# Patient Record
Sex: Male | Born: 1990 | Race: Black or African American | Hispanic: No | Marital: Single | Smoking: Former smoker
Health system: Southern US, Community
[De-identification: ages and names within clinical notes are randomized; demographics above are authoritative.]

## PROBLEM LIST (undated history)

## (undated) DIAGNOSIS — F209 Schizophrenia, unspecified: Secondary | ICD-10-CM

## (undated) DIAGNOSIS — F32A Depression, unspecified: Secondary | ICD-10-CM

## (undated) DIAGNOSIS — R440 Auditory hallucinations: Secondary | ICD-10-CM

## (undated) DIAGNOSIS — K219 Gastro-esophageal reflux disease without esophagitis: Secondary | ICD-10-CM

## (undated) DIAGNOSIS — R079 Chest pain, unspecified: Secondary | ICD-10-CM

## (undated) DIAGNOSIS — K602 Anal fissure, unspecified: Secondary | ICD-10-CM

## (undated) DIAGNOSIS — Z765 Malingerer [conscious simulation]: Secondary | ICD-10-CM

## (undated) DIAGNOSIS — Z5900 Homelessness unspecified: Secondary | ICD-10-CM

## (undated) DIAGNOSIS — G8929 Other chronic pain: Secondary | ICD-10-CM

## (undated) HISTORY — DX: Schizophrenia, unspecified: F20.9

## (undated) HISTORY — DX: Depression, unspecified: F32.A

---

## 2014-06-09 ENCOUNTER — Emergency Department: Payer: Self-pay

## 2014-06-09 ENCOUNTER — Emergency Department
Admission: EM | Admit: 2014-06-09 | Discharge: 2014-06-09 | Disposition: A | Discharge status: Home / Self Care | Payer: Charity | Attending: Emergency Medicine | Admitting: Emergency Medicine

## 2014-06-09 DIAGNOSIS — M79672 Pain in left foot: Secondary | ICD-10-CM | POA: Insufficient documentation

## 2014-06-09 DIAGNOSIS — M79671 Pain in right foot: Secondary | ICD-10-CM | POA: Insufficient documentation

## 2014-06-09 MED ORDER — ACETAMINOPHEN 500 MG PO TABS
1000.0000 mg | ORAL_TABLET | Freq: Once | ORAL | Status: AC
Start: 2014-06-09 — End: 2014-06-09
  Administered 2014-06-09: 1000 mg via ORAL
  Filled 2014-06-09: qty 2

## 2014-06-09 NOTE — ED Provider Notes (Signed)
The patient was seen and examined by the PA;  I have reviewed and agree with the history.  The pertinent physical exam has been documented and the plan of care was discussed with me.    [name] is a 24 y.o. male p/w b/l foot pain just PTA. [place] metro and then woke up with foot pain. He denies fall, trauma or other concerns       Pertinent physical: no TTP feet, legs, knees. FROM in BLE  ____________________________________________________________________    I was acting as a Neurosurgeon for Robert Baseman, MD on [facility] M  Treatment Team: Scribe: Kavin Leech     I am the first provider for this patient and I personally performed the services documented. Treatment Team: Scribe: Kavin Leech is scribing for me on [facility] M. This note accurately reflects work and decisions made by me.  Jaidence Geisler, Chipper Herb, MD____________________________________________________________________     Robert Baseman, MD  06/20/14 [phone]

## 2014-06-09 NOTE — Discharge Instructions (Signed)
You can take tylenol or ibuprofen as needed for pain.

## 2014-06-09 NOTE — ED Provider Notes (Signed)
Physician/Midlevel provider first contact with patient: 06/09/14 0117         History     Chief Complaint   Patient presents with   . Foot Pain     HPI Comments: 24 y/o male with no significant PMH presents with foot pain. Pt states that he [place] the wrong metro and when he woke up he had foot pain, that intermittently radiates to his knees. Denies falling/tripping/crushing foot or leg. Pt states before he got on the metro he was walking around [place] circle without difficulty. Has not tried anything for the pain. Denies HA/lightheadedness/dizziness, CP, SOB, SI/HI, hallucinations. Denies any illicit drug use.     Patient is a 24 y.o. male presenting with lower extremity pain. The history is provided by the patient.   Foot Pain  This is a new problem. The current episode started today. Pertinent negatives include no abdominal pain, chest pain, chills, coughing, diaphoresis, fatigue, fever, headaches, joint swelling, nausea, neck pain, numbness, rash, sore throat, vertigo, visual change, vomiting or weakness. He has tried nothing for the symptoms.            History reviewed. No pertinent past medical history.    History reviewed. No pertinent past surgical history.    No family history on file.    Social  History   Substance Use Topics   . Smoking status: Not on file   . Smokeless tobacco: Not on file   . Alcohol Use: Not on file       .     No Known Allergies    Current/Home Medications    No medications on file        Review of Systems   Constitutional: Negative for fever, chills, diaphoresis and fatigue.   HENT: Negative for sore throat.    Eyes: Negative for visual disturbance.   Respiratory: Negative for cough.    Cardiovascular: Negative for chest pain.   Gastrointestinal: Negative for nausea, vomiting and abdominal pain.   Genitourinary: Negative for dysuria.   Musculoskeletal: Negative for joint swelling and neck pain.   Skin: Negative for rash.   Neurological: Negative for dizziness, vertigo, weakness,  light-headedness, numbness and headaches.       Physical Exam    BP: 127/77 mmHg, Heart Rate: 61, Temp: 98.7 F (37.1 C), Resp Rate: 18, SpO2: 100 %, Weight: 72.576 kg    Physical Exam   Constitutional: He is oriented to person, place, and time. He appears well-developed and well-nourished.   HENT:   Head: Normocephalic and atraumatic.   Eyes: EOM are normal.   Pupils dilated bilaterally    Neck: Normal range of motion. Neck supple.   Cardiovascular: Normal rate and regular rhythm.    Pulses:       Dorsalis pedis pulses are 3+ on the right side, and 3+ on the left side.   Pulmonary/Chest: Effort normal and breath sounds normal. No respiratory distress. He has no wheezes.   Abdominal: Soft. Bowel sounds are normal. He exhibits no distension.   Musculoskeletal:        Right knee: Normal.        Left knee: Normal.        Right foot: There is normal range of motion and no deformity.        Left foot: There is normal range of motion and no deformity.   No TTP of feet or legs, full ROM without difficulty    Feet:   Right Foot:  Skin Integrity: Positive for dry skin. Negative for ulcer, blister, skin breakdown, erythema, warmth or callus.   Left Foot:   Skin Integrity: Positive for dry skin. Negative for ulcer, blister, skin breakdown, erythema, warmth or callus.   Neurological: He is alert and oriented to person, place, and time.   Skin: Skin is warm and dry.   Psychiatric: He has a normal mood and affect.   Nursing note and vitals reviewed.        MDM and ED Course     ED Medication Orders     None              MDM  Number of Diagnoses or Management Options  Diagnosis management comments: DDx: Pain, rash, blisters, sprain, stain      I have reviewed vital signs, nursing notes, past medical history, past surgical history, social history and family history         Amount and/or Complexity of Data Reviewed  Tests in the medicine section of CPT: reviewed (O2 saturation 100% on RA, no hypoxia)  Discuss the patient with  other providers: yes (ED attending)    Risk of Complications, Morbidity, and/or Mortality  Presenting problems: low  Diagnostic procedures: low  Management options: low    Patient Progress  Patient progress: stable         Procedures    Clinical Impression & Disposition   24 y/o male with no significant PMH presents with foot pain.    - On exam pt with dilated pupils, denies drug use. When patient distracted, no pain to feet or legs, stood up to walk to the bathroom without difficulty.   - Discussed with Dr. Dorthula Matas, agrees with d/c home.   - Discussed findings with patient, verbalized understanding, states that he does not have a place tonight, will have SW come to see patient.   - Will give dose of tylenol in ED.     Dx:  1. Pain in both feet        Plan: D/C home, transition clinc info given, homeless shelter list provided, return to ED precautions given.     Clinical Impression  Final diagnoses:   Pain in both feet        ED Disposition     Discharge Stevphen Meuse discharge to home/self care.    Condition at disposition: Stable             There are no discharge medications for this patient.       Treatment Team: Scribe: Gillie Manners [place], [place]  06/09/14 [phone]

## 2014-06-09 NOTE — ED Notes (Signed)
[place] from [place]. Pt c/o of sudden onset of right foot pain that radiates to his knee cap. Denies any trauma or injury. Denies any long trips or travel. Denies any CP, SOB, dizziness or nausea.

## 2014-06-11 ENCOUNTER — Emergency Department
Admission: EM | Admit: 2014-06-11 | Discharge: 2014-06-11 | Disposition: A | Discharge status: Home / Self Care | Payer: Charity | Attending: Emergency Medical Services | Admitting: Emergency Medical Services

## 2014-06-11 ENCOUNTER — Emergency Department: Payer: Self-pay

## 2014-06-11 ENCOUNTER — Emergency Department: Payer: Charity

## 2014-06-11 DIAGNOSIS — S93401A Sprain of unspecified ligament of right ankle, initial encounter: Secondary | ICD-10-CM | POA: Insufficient documentation

## 2014-06-11 DIAGNOSIS — Y9301 Activity, walking, marching and hiking: Secondary | ICD-10-CM | POA: Insufficient documentation

## 2014-06-11 MED ORDER — IBUPROFEN 600 MG PO TABS
600.0000 mg | ORAL_TABLET | Freq: Four times a day (QID) | ORAL | Status: DC | PRN
Start: 2014-06-11 — End: 2017-03-12

## 2014-06-11 NOTE — Progress Notes (Signed)
Cab voucher provided for pt to [employer].

## 2014-06-11 NOTE — Discharge Instructions (Signed)
Ankle Sprain    You have been diagnosed with an ankle sprain.    A sprain is a ligament injury, usually a tear or partial tear. Sprains can hurt as much as broken bones. Sprains can be classified by the degree of injury. A first-degree sprain is considered a minor tear. A second-degree sprain is a partial tear of the ligament. A third-degree sprain often involves a small fracture, or break, of the bone that the ligament is attached to.    Sprains are usually treated with pain medication and a splint to keep the joint from moving. You should Rest, Ice, Compress, and Elevate the injured ankle. Remember this as "RICE."   REST: Limit the use of the injured body part.   ICE: By applying ice to the affected area, swelling and pain can be reduced. Place some ice cubes in a re-sealable (Ziploc) bag and add some water. Put a thin washcloth between the bag and the skin. Apply the ice bag to the area for at least 20 minutes. Do this at least 4 times per day. Using the ice for longer times and more frequently is OK. NEVER APPLY ICE DIRECTLY TO THE SKIN.   COMPRESS: Compression means to apply pressure around the injured area such as with a splint, cast or an ACE bandage. Compression decreases swelling and improves comfort. Compression should be tight enough to relieve swelling but not so tight as to decrease circulation. Increasing pain, numbness, tingling, or change in skin color, are all signs of decreased circulation.   ELEVATE: Elevate the injured part. For example, a sprained ankle can be placed up on a chair while sitting and propped up on pillows while lying down.    You have been given an ACE BANDAGE. The bandage will compress the ankle. This increases comfort and reduces swelling. The ACE bandage should fit snugly but not so tight as to decrease circulation (blood supply). Watch for swelling of the area outside the ACE wrap. Check capillary refill (circulation) in your toenails. To do this, press on the  nail. It should turn white. When you let go, the nail should return to pink in less than 2 seconds. If it doesn't, the bandage is too tight. Loosen the wrap if you need to.    Wear the ACE bandage:   For the next 1-2 weeks.    Ankle exercises are described below. Begin the exercises as soon as you are able. They will make the ankle stronger to prevent new injuries. Do the exercises 5 to 10 times each day.   Use your big toe to draw out the letters of the alphabet on the ground. Move your ankle as you make each letter.   Sit with your leg straight out in front of you. Wrap a towel around the ball of your foot (just below your toes) and pull back. Pull hard enough to stretch the ankle. Don't pull hard enough to cause pain. Hold the stretch for 30 seconds.   Stand up. Rock onto the tiptoes of the injured foot and then return to the flat position. Repeat 10 times.   Rotate your ankle in a circle. Make 10 clockwise circles, then make 10 circles going the other way.    YOU SHOULD SEEK MEDICAL ATTENTION IMMEDIATELY, EITHER HERE OR AT THE NEAREST EMERGENCY DEPARTMENT, IF ANY OF THE FOLLOWING OCCURS:   Your pain gets much worse.   Your ankle or foot starts to tingle or it becomes numb.   Your foot   is cold or pale. This might mean there is a problem with circulation (blood supply).        JACOB CICERO  [id number]  [id number]  [phone]  06/11/2014    Discharge Instructions    As always, you are the most important factor in your recovery.  Please follow these instructions carefully.  If you have problems that we have not discussed, CALL OR VISIT YOUR DOCTOR RIGHT AWAY.     If you can't reach your doctor, return to the emergency department.    I Stevphen Meuse understand the written and discussed instructions.  My questions have been answered.  I acknowledge receipt of these instructions.     Patient or responsible person:         Patient's Signature               Physician or Nurse

## 2014-06-11 NOTE — ED Provider Notes (Signed)
[facility] EMERGENCY DEPARTMENT H&P                                             ATTENDING SUPERVISORY NOTE       ATTENDING NOTE        Turned ankle, exam unimpressive, xray negative, patient sleeping when I went to evaluate/     I spoke to and examined the patient as well: Yes  I was present during key portions of any procedures performed: N/A            VISIT INFORMATION        Clinical Course in the ED:            Medications Given in the ED:    .     ED Medication Orders     None            Procedures:            Interpretations:      Radiology -     interpreted by me with the following observations: No fracture               PAST HISTORY        Primary Care Provider: Christa See, MD        PMH/PSH:    .     History reviewed. No pertinent past medical history.    He has no past surgical history on file.      Social/Family History:      He reports that he has never smoked. He does not have any smokeless tobacco history on file. He reports that he does not drink alcohol or use illicit drugs.    No family history on file.      Listed Medications on Arrival:    .     Home Medications           No Medications         Allergies: He has No Known Allergies.            RESULTS        Lab Results:      Results     ** No results found for the last 24 hours. **              Radiology Results:      Ankle Right 3+ Views   Final Result    No appreciable fracture.      Prince Solian, MD    06/11/2014 8:30 PM                     Attending Attestation:      The patient was seen and examined by the mid-level (physician's assistant or nurse practitioner), or fellow, and the plan of care was discussed with me. I agree with the plan as it was presented to me.  I have reviewed and agree with the final ED diagnosis.              Scribe Attestation:      No scribe involved in the care of this patient           Juliane Poot, MD  06/11/14 2055

## 2014-06-11 NOTE — ED Provider Notes (Signed)
[facility] EMERGENCY DEPARTMENT APP H&P         CLINICAL SUMMARY      THIS CHART HAS BEEN COMPLETED AND IS READY TO SIGN.      Diagnosis:    .     Final diagnoses:   Sprain of right ankle, unspecified ligament, initial encounter         MDM Notes:      MDM  Number of Diagnoses or Management Options  Sprain of right ankle, unspecified ligament, initial encounter: new, needed workup     Amount and/or Complexity of Data Reviewed  Tests in the radiology section of CPT: ordered and reviewed  Discussion of test results with the performing providers: yes  Discuss the patient with other providers: yes  Independent visualization of images, tracings, or specimens: yes            Disposition:           ED Disposition     Discharge Stevphen Meuse discharge to home/self care.    Condition at disposition: Stable                      CLINICAL INFORMATION        HPI:      Chief Complaint: Ankle Injury  .    GERAL COKER is a 24 y.o. male with no pertinent PMhx who presents c/o right ankle pain with associated swelling onset just PTA. Pt was walking when he stepped on an uneven surface and twisted his ankle. No fall, head injury or LOC. He has been ambulatory with slight difficulty secondary to pain. Denies headaches, knee pain, nausea, vomiting, CP, SOB, paresthesias.    History obtained from: Patient      ROS:      Positive and negative ROS elements as per HPI.      Physical Exam:      Pulse 83  BP 114/71 mmHg  Resp 16  SpO2 96 %  Temp 98.7 F (37.1 C)    Physical Exam   Constitutional: He is oriented to person, place, and time. He appears well-developed and well-nourished. No distress.   HENT:   Head: Normocephalic and atraumatic.   Right Ear: External ear normal.   Left Ear: External ear normal.   Eyes: Conjunctivae and EOM are normal. Pupils are equal, round, and reactive to light.   Pulmonary/Chest: Effort normal. No respiratory distress.   Musculoskeletal:   Right ankle grossly  unremarkable. No deformity. No soft tissue swelling. Minimal TTP.  Medial tenderness at lateral malleolus. Foot non tender. Achilles intact. No deficit. Knee unremarkable. FROM.    Neurological: He is alert and oriented to person, place, and time. Coordination normal.   Skin: Skin is warm and dry. No rash noted. He is not diaphoretic. No erythema. No pallor.   Psychiatric: He has a normal mood and affect. His behavior is normal. Judgment and thought content normal. His speech is not slurred. Cognition and memory are normal. He is communicative. He is attentive.   Nursing note and vitals reviewed.                  PAST HISTORY        Primary Care Provider: Christa See, MD        PMH/PSH:    .     History reviewed. No pertinent past medical history.    He has no past surgical history on file.  Social/Family History:      He reports that he has never smoked. He does not have any smokeless tobacco history on file. He reports that he does not drink alcohol or use illicit drugs.    No family history on file.      Listed Medications on Arrival:    .     Discharge Medication List as of 06/11/2014  8:54 PM         Allergies: He has No Known Allergies.            VISIT INFORMATION        Clinical Course in the ED:            Medications Given in the ED:    .     ED Medication Orders     None            Procedures:      Procedures      Interpretations:                      RESULTS        Lab Results:      Results     ** No results found for the last 24 hours. **              Radiology Results:      Ankle Right 3+ Views   Final Result    No appreciable fracture.      Prince Solian, MD    06/11/2014 8:30 PM                     Scribe Attestation:      I was acting as a Neurosurgeon for Scarlette Shorts, PA-C on [facility]   I am the first provider for this patient and I personally performed the services documented. Joni Reining is scribing for me on [facility] M. This note accurately reflects work and  decisions made by me.  Scarlette Shorts, PA-C             Erick Blinks, [place]  06/13/14 [phone]

## 2014-06-11 NOTE — ED Provider Notes (Signed)
Dr. Wilma Flavin, acting as triage physician, entered orders based on brief H&P.    Robert Gibbon, MD  06/11/14 [phone]

## 2014-06-12 ENCOUNTER — Emergency Department: Payer: Charity

## 2014-06-12 ENCOUNTER — Emergency Department
Admission: EM | Admit: 2014-06-12 | Discharge: 2014-06-12 | Disposition: A | Discharge status: Home / Self Care | Payer: Charity | Attending: Emergency Medicine | Admitting: Emergency Medicine

## 2014-06-12 DIAGNOSIS — M25561 Pain in right knee: Secondary | ICD-10-CM | POA: Insufficient documentation

## 2014-06-12 DIAGNOSIS — M25562 Pain in left knee: Secondary | ICD-10-CM | POA: Insufficient documentation

## 2014-06-12 MED ORDER — IBUPROFEN 600 MG PO TABS
800.0000 mg | ORAL_TABLET | Freq: Once | ORAL | Status: AC
Start: 2014-06-12 — End: 2014-06-12
  Administered 2014-06-12: 800 mg via ORAL
  Filled 2014-06-12 (×2): qty 1

## 2014-06-12 NOTE — ED Notes (Signed)
Pt reports to the ED with c/o joint pain in his legs, it started a few hours ago after he was walking.

## 2014-06-12 NOTE — ED Notes (Signed)
Pt alert & responsive. Verbally aggressive. MD aware. Pt d/c with security escort

## 2014-06-12 NOTE — Discharge Instructions (Signed)
Knee Pain NOS     You have been seen for knee pain.     There are a few causes for knee pain. The doctor feels your knee pain is not from an injury to your knee's bones or ligaments.      Injury to the ligaments or bones is not the only cause of knee pain. There are other causes. These include:  · Tendonitis. This is the inflammation (swelling) of the tendons. Tendons are the thick cords that connect the muscles around the knee to the bones of the knee joint.  · Bursitis. This is the inflammation (swelling) of the fluid-filled sacs that cushion the knee joint.  · Arthritis (inflammation of joints).  · Gout (swelling of the joints).  · Knee injuries from overuse.     Some things you can do to treat your knee pain are:  · Apply ice to the knee with an ice pack. Be sure to put a towel between the ice pack and your skin. NEVER PLACE DIRECTLY ON YOUR SKIN. You can do this for 15 minutes at a time, several times a day.  · Use anti-inflammatory medicine like ibuprofen (Advil® or Motrin®) to help the pain and swelling.  · Avoid doing things that put a lot of stress on your knee joints. This includes running or playing tennis.     YOU SHOULD SEEK MEDICAL ATTENTION IMMEDIATELY, EITHER HERE OR AT THE NEAREST EMERGENCY DEPARTMENT, IF ANY OF THE FOLLOWING OCCUR:  · Your knee pain gets worse.  · You have fever (temperature higher than 100.4ºF / 38ºC) or chills or your knee gets more red or warm.  · You have any other problems or concerns.

## 2014-06-13 NOTE — ED Provider Notes (Signed)
Physician/Midlevel provider first contact with patient: 06/12/14 2149         History     Chief Complaint   Patient presents with   . Joint Pain     Patient presents to the ED complaining of bilateral knee pain after walking.  Pain is located posteriorly and has been constant.  No prior injuries.  Has not taken anything for the pain.          History reviewed. No pertinent past medical history.    History reviewed. No pertinent past surgical history.    No family history on file.    Social  History   Substance Use Topics   . Smoking status: Never Smoker    . Smokeless tobacco: Not on file   . Alcohol Use: No       .     No Known Allergies    Discharge Medication List as of 06/12/2014 11:03 PM      CONTINUE these medications which have NOT CHANGED    Details   ibuprofen (ADVIL,MOTRIN) 600 MG tablet Take 1 tablet (600 mg total) by mouth every 6 (six) hours as needed for Pain or Fever., Starting 06/11/2014, Until Discontinued, Print              Review of Systems   Constitutional: Negative.  Negative for fever and chills.   Musculoskeletal: Negative for back pain.        Knee pain   Skin: Negative.    Neurological: Negative.  Negative for weakness and numbness.   All other systems reviewed and are negative.      Physical Exam    BP: 120/68 mmHg, Heart Rate: 71, Temp: 97.7 F (36.5 C), Resp Rate: 18, SpO2: 93 %     Physical Exam   Constitutional: He appears well-developed and well-nourished. No distress.   Musculoskeletal:        Right knee: Normal. He exhibits normal range of motion, no effusion, no ecchymosis, normal alignment, normal patellar mobility and no bony tenderness.        Left knee: Normal. He exhibits normal range of motion, no swelling, no effusion, no erythema and no bony tenderness.   Neurological: He is alert. He has normal strength. Gait normal.   Skin: Skin is warm, dry and intact.   Nursing note and vitals reviewed.        MDM and ED Course     ED Medication Orders     Start     Status Ordering  Provider    06/12/14 2212  ibuprofen (ADVIL,MOTRIN) tablet 800 mg   Once     Route: Oral  Ordered Dose: 800 mg     Last MAR action:  Given Adrianna Dudas, Conchita Paris              MDM     XR Knee 3 View Right   ED Interpretation   Normal knee      Final Result    Limited study with no lateral view due to patient refusal.   Available views demonstrate no fracture or acute process          Heron Nay, MD    06/13/2014 8:56 AM         Knee 4+ Views Left   ED Interpretation   Normal knee      Final Result    No fracture or acute process          Heron Nay, MD  06/13/2014 8:55 AM           No acute process and normal exam.  Possibly muscular from walking.  Improved with motrin.  Will discharge.    Procedures    Clinical Impression & Disposition     Clinical Impression  Final diagnoses:   Bilateral knee pain        ED Disposition     Discharge [name] discharge to home/self care.    Condition at disposition: Stable             Discharge Medication List as of 06/12/2014 11:03 PM                    Larina Bras, MD  06/14/14 0002

## 2016-03-31 ENCOUNTER — Emergency Department (EMERGENCY_DEPARTMENT_HOSPITAL): Payer: Medicaid Other

## 2016-03-31 ENCOUNTER — Inpatient Hospital Stay: Payer: Charity | Admitting: Psychosomatic Medicine

## 2016-03-31 ENCOUNTER — Inpatient Hospital Stay
Admission: EM | Admit: 2016-03-31 | Discharge: 2016-04-08 | DRG: 751 | Disposition: A | Discharge status: Home / Self Care | Payer: Medicaid HMO | Attending: Psychiatry | Admitting: Psychiatry

## 2016-03-31 DIAGNOSIS — Z720 Tobacco use: Secondary | ICD-10-CM

## 2016-03-31 DIAGNOSIS — Z560 Unemployment, unspecified: Secondary | ICD-10-CM

## 2016-03-31 DIAGNOSIS — F339 Major depressive disorder, recurrent, unspecified: Secondary | ICD-10-CM

## 2016-03-31 DIAGNOSIS — F323 Major depressive disorder, single episode, severe with psychotic features: Secondary | ICD-10-CM | POA: Diagnosis present

## 2016-03-31 DIAGNOSIS — R45850 Homicidal ideations: Secondary | ICD-10-CM | POA: Diagnosis present

## 2016-03-31 DIAGNOSIS — Z590 Homelessness: Secondary | ICD-10-CM

## 2016-03-31 DIAGNOSIS — R443 Hallucinations, unspecified: Secondary | ICD-10-CM

## 2016-03-31 DIAGNOSIS — F29 Unspecified psychosis not due to a substance or known physiological condition: Secondary | ICD-10-CM

## 2016-03-31 DIAGNOSIS — F333 Major depressive disorder, recurrent, severe with psychotic symptoms: Principal | ICD-10-CM | POA: Diagnosis present

## 2016-03-31 DIAGNOSIS — R45851 Suicidal ideations: Secondary | ICD-10-CM | POA: Diagnosis present

## 2016-03-31 DIAGNOSIS — F419 Anxiety disorder, unspecified: Secondary | ICD-10-CM

## 2016-03-31 LAB — GFR: EGFR: >60

## 2016-03-31 LAB — CBC AND DIFFERENTIAL
Absolute NRBC: 0 10*3/uL
Basophils Absolute Automated: 0.02 10*3/uL (ref 0.00–0.20)
Basophils Automated: 0.3 %
Eosinophils Absolute Automated: 0.01 10*3/uL (ref 0.00–0.70)
Eosinophils Automated: 0.1 %
Hematocrit: 46 % (ref 42.0–52.0)
Hgb: 15.4 g/dL (ref 13.0–17.0)
Immature Granulocytes Absolute: 0.03 10*3/uL
Immature Granulocytes: 0.4 %
Lymphocytes Absolute Automated: 1.69 10*3/uL (ref 0.50–4.40)
Lymphocytes Automated: 21.2 %
MCH: 29 pg (ref 28.0–32.0)
MCHC: 33.5 g/dL (ref 32.0–36.0)
MCV: 86.6 fL (ref 80.0–100.0)
MPV: 9.6 fL (ref 9.4–12.3)
Monocytes Absolute Automated: 0.48 10*3/uL (ref 0.00–1.20)
Monocytes: 6 %
Neutrophils Absolute: 5.76 10*3/uL (ref 1.80–8.10)
Neutrophils: 72 %
Nucleated RBC: 0 /100 WBC (ref 0.0–1.0)
Platelets: 211 10*3/uL (ref 140–400)
RBC: 5.31 10*6/uL (ref 4.70–6.00)
RDW: 13 % (ref 12–15)
WBC: 7.99 10*3/uL (ref 3.50–10.80)

## 2016-03-31 LAB — RAPID DRUG SCREEN, URINE
Barbiturate Screen, UR: NEGATIVE
Benzodiazepine Screen, UR: NEGATIVE
Cannabinoid Screen, UR: NEGATIVE
Cocaine, UR: NEGATIVE
Opiate Screen, UR: NEGATIVE
PCP Screen, UR: NEGATIVE
Urine Amphetamine Screen: NEGATIVE

## 2016-03-31 LAB — BASIC METABOLIC PANEL
BUN: 10 mg/dL (ref 9.0–28.0)
CO2: 26 mEq/L (ref 22–29)
Calcium: 9.8 mg/dL (ref 8.5–10.5)
Chloride: 101 mEq/L (ref 100–111)
Creatinine: 1.1 mg/dL (ref 0.7–1.3)
Glucose: 92 mg/dL (ref 70–100)
Potassium: 4.1 mEq/L (ref 3.5–5.1)
Sodium: 137 mEq/L (ref 136–145)

## 2016-03-31 LAB — URINALYSIS, REFLEX TO MICROSCOPIC EXAM IF INDICATED
Bilirubin, UA: NEGATIVE
Blood, UA: NEGATIVE
Glucose, UA: NEGATIVE
Ketones UA: NEGATIVE
Leukocyte Esterase, UA: NEGATIVE
Nitrite, UA: NEGATIVE
Protein, UR: NEGATIVE
Specific Gravity UA: 1.017 (ref 1.001–1.035)
Urine pH: 7 (ref 5.0–8.0)
Urobilinogen, UA: NORMAL mg/dL

## 2016-03-31 LAB — SALICYLATE LEVEL: Salicylate Level: <5 mg/dL — ABNORMAL LOW (ref 15.0–30.0)

## 2016-03-31 LAB — TSH: TSH: 1.81 u[IU]/mL (ref 0.35–4.94)

## 2016-03-31 LAB — ETHANOL: Alcohol: NOT DETECTED mg/dL

## 2016-03-31 LAB — ACETAMINOPHEN LEVEL: Acetaminophen Level: <7 ug/mL — ABNORMAL LOW (ref 10–30)

## 2016-03-31 MED ORDER — NICOTINE 21 MG/24HR TD PT24
1.0000 | MEDICATED_PATCH | Freq: Every day | TRANSDERMAL | Status: DC
Start: 2016-03-31 — End: 2016-04-08
  Administered 2016-04-04: 1 via TRANSDERMAL
  Filled 2016-03-31 (×6): qty 1

## 2016-03-31 MED ORDER — HALOPERIDOL LACTATE 5 MG/ML IJ SOLN
5.0000 mg | Freq: Four times a day (QID) | INTRAMUSCULAR | Status: DC | PRN
Start: 2016-03-31 — End: 2016-04-08

## 2016-03-31 MED ORDER — LORAZEPAM 1 MG PO TABS
2.0000 mg | ORAL_TABLET | Freq: Once | ORAL | Status: AC
Start: 2016-03-31 — End: 2016-03-31
  Administered 2016-03-31: 2 mg via ORAL
  Filled 2016-03-31: qty 2

## 2016-03-31 MED ORDER — ZOLPIDEM TARTRATE 5 MG PO TABS
5.0000 mg | ORAL_TABLET | Freq: Every evening | ORAL | Status: DC | PRN
Start: 2016-03-31 — End: 2016-04-08

## 2016-03-31 MED ORDER — ACETAMINOPHEN 325 MG PO TABS
650.0000 mg | ORAL_TABLET | Freq: Four times a day (QID) | ORAL | Status: DC | PRN
Start: 2016-03-31 — End: 2016-04-08

## 2016-03-31 MED ORDER — LORAZEPAM 2 MG/ML IJ SOLN
2.0000 mg | Freq: Four times a day (QID) | INTRAMUSCULAR | Status: DC | PRN
Start: 2016-03-31 — End: 2016-04-08

## 2016-03-31 MED ORDER — OLANZAPINE 10 MG PO TABS
10.0000 mg | ORAL_TABLET | Freq: Four times a day (QID) | ORAL | Status: DC | PRN
Start: 2016-03-31 — End: 2016-04-08
  Administered 2016-04-02 – 2016-04-06 (×3): 10 mg via ORAL
  Filled 2016-03-31 (×5): qty 1

## 2016-03-31 MED ORDER — LORAZEPAM 1 MG PO TABS
2.0000 mg | ORAL_TABLET | Freq: Four times a day (QID) | ORAL | Status: DC | PRN
Start: 2016-03-31 — End: 2016-04-08

## 2016-03-31 MED ORDER — BENZTROPINE MESYLATE 1 MG/ML IJ SOLN
2.0000 mg | INTRAMUSCULAR | Status: DC | PRN
Start: 2016-03-31 — End: 2016-04-08

## 2016-03-31 MED ORDER — OLANZAPINE 10 MG IM SOLR
10.0000 mg | Freq: Four times a day (QID) | INTRAMUSCULAR | Status: DC | PRN
Start: 2016-03-31 — End: 2016-04-08

## 2016-03-31 MED ORDER — BENZTROPINE MESYLATE 2 MG PO TABS
2.0000 mg | ORAL_TABLET | ORAL | Status: DC | PRN
Start: 2016-03-31 — End: 2016-04-08

## 2016-03-31 MED ORDER — NICOTINE POLACRILEX 2 MG MT GUM
2.0000 mg | CHEWING_GUM | OROMUCOSAL | Status: DC | PRN
Start: 2016-03-31 — End: 2016-04-08

## 2016-03-31 MED ORDER — DIPHENHYDRAMINE HCL 25 MG PO CAPS
50.0000 mg | ORAL_CAPSULE | Freq: Four times a day (QID) | ORAL | Status: DC | PRN
Start: 2016-03-31 — End: 2016-04-08

## 2016-03-31 MED ORDER — OLANZAPINE 5 MG PO TABS
5.0000 mg | ORAL_TABLET | Freq: Two times a day (BID) | ORAL | Status: DC
Start: 2016-03-31 — End: 2016-04-01
  Administered 2016-03-31 – 2016-04-01 (×2): 5 mg via ORAL
  Filled 2016-03-31 (×2): qty 1

## 2016-03-31 MED ORDER — IBUPROFEN 600 MG PO TABS
600.0000 mg | ORAL_TABLET | Freq: Four times a day (QID) | ORAL | Status: DC | PRN
Start: 2016-03-31 — End: 2016-04-08

## 2016-03-31 MED ORDER — DIPHENHYDRAMINE HCL 50 MG/ML IJ SOLN
50.0000 mg | Freq: Four times a day (QID) | INTRAMUSCULAR | Status: DC | PRN
Start: 2016-03-31 — End: 2016-04-08

## 2016-03-31 MED ORDER — HALOPERIDOL 5 MG PO TABS
5.0000 mg | ORAL_TABLET | Freq: Four times a day (QID) | ORAL | Status: DC | PRN
Start: 2016-03-31 — End: 2016-04-08
  Administered 2016-04-03 – 2016-04-05 (×2): 5 mg via ORAL
  Filled 2016-03-31 (×3): qty 1
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
## 2016-03-31 NOTE — Plan of Care (Signed)
Problem: Side Effects from Pain Analgesia  Goal: Patient will experience minimal side effects of analgesic therapy  Outcome: Completed Date Met: 03/31/16

## 2016-03-31 NOTE — ED Notes (Signed)
Bed: N 43  Expected date:   Expected time:   Means of arrival:   Comments:  [id number]

## 2016-03-31 NOTE — ED Provider Notes (Signed)
Physician/Midlevel provider first contact with patient: 03/31/16 1223                                        [facility] EMERGENCY DEPARTMENT H&P                                             ATTENDING SUPERVISORY NOTE       ATTENDING NOTE        HPI  [name] is a 25 y.o. male h/o bipolar disorder; BIBA who p/w SI and auditory hallucinations today. States that he has been hearing voices telling him to kill himself by jumping off a bridge/stairs or throw himself over things. He notes that the voices today were now taunting him, telling him that someone was coming after him to kill him. He reports running to a store to call 911, therefore here for further eval.    Physical Exam  Flat affect. +HI. +SI.    I spoke to and examined the patient as well: Yes  I was present during key portions of any procedures performed: N/A               VISIT INFORMATION        Clinical Course in the ED:      The patient will be admitted to psychiatry inpatient voluntary for psychosis and hallucinations.      Medications Given in the ED:    .     ED Medication Orders     Start Ordered     Status Ordering Provider    03/31/16 1451 03/31/16 1450  LORazepam (ATIVAN) tablet 2 mg  Once     Route: Oral  Ordered Dose: 2 mg     Last MAR action:  Given LARSEN, ERIC L            Procedures:            Interpretations:      EKG -             interpreted by me: normal sinus at 83, no acute ischemic changes.               PAST HISTORY        Primary Care Provider: Christa See, MD        PMH/PSH:    .     History reviewed. No pertinent past medical history.    He has no past surgical history on file.         Social/Family History:      He reports that he has never smoked. He does not have any smokeless tobacco history on file. He reports that he does not drink alcohol or use drugs.    No family history on file.         Listed Medications on Arrival:    .     Home Medications     Med List Status:  In Progress Set By: Leonette Nutting, RN  at 03/31/2016 12:18 PM                ibuprofen (ADVIL,MOTRIN) 600 MG tablet     Take 1 tablet (600 mg total) by mouth every 6 (six) hours as needed for Pain or Fever.  Allergies: He has No Known Allergies.               RESULTS        Lab Results:      Results     Procedure Component Value Units Date/Time    TSH [[id number]] Collected:  03/31/16 1345    Specimen:  Blood Updated:  03/31/16 1436     Thyroid Stimulating Hormone 1.81 uIU/mL     Rapid drug screen, urine [[id number]] Collected:  03/31/16 1350    Specimen:  Urine Updated:  03/31/16 1427     Amphetamine Screen, UR Negative     Barbiturate Screen, UR Negative     Benzodiazepine Screen, UR Negative     Cannabinoid Screen, UR Negative     Cocaine, UR Negative     Opiate Screen, UR Negative     PCP Screen, UR Negative    UA, Reflex to Microscopic (pts  3 + yrs) [[id number]] Collected:  03/31/16 1350    Specimen:  Urine Updated:  03/31/16 1422     Urine Type Clean Catch     Color, UA Yellow     Clarity, UA Clear     Specific Gravity UA 1.017     Urine pH 7.0     Leukocyte Esterase, UA Negative     Nitrite, UA Negative     Protein, UR Negative     Glucose, UA Negative     Ketones UA Negative     Urobilinogen, UA Normal mg/dL      Bilirubin, UA Negative     Blood, UA Negative    Salicylate Level [[id number]]  (Abnormal) Collected:  03/31/16 1345    Specimen:  Blood Updated:  03/31/16 1416     Salicylate Level <5.0 (L) mg/dL     GFR [[id number]] Collected:  03/31/16 1345     Updated:  03/31/16 1416     EGFR >60.0    Basic Metabolic Panel (BMP) [[id number]] Collected:  03/31/16 1345    Specimen:  Blood Updated:  03/31/16 1416     Glucose 92 mg/dL      BUN 51.7 mg/dL      Creatinine 1.1 mg/dL      Calcium 9.8 mg/dL      Sodium 616 mEq/L      Potassium 4.1 mEq/L      Chloride 101 mEq/L      CO2 26 mEq/L     Acetaminophen Level [[id number]]  (Abnormal) Collected:  03/31/16 1345    Specimen:  Blood Updated:  03/31/16 1416     Acetaminophen Level <7 (L) ug/mL      Ethanol (ALCOHOL) Level [[id number]] Collected:  03/31/16 1345    Specimen:  Blood Updated:  03/31/16 1416     Alcohol None Detected mg/dL     CBC with differential [[id number]] Collected:  03/31/16 1259    Specimen:  Blood from Blood Updated:  03/31/16 1309     WBC 7.99 x10 3/uL      Hgb 15.4 g/dL      Hematocrit 29.9 %      Platelets 211 x10 3/uL      RBC 5.31 x10 6/uL      MCV 86.6 fL      MCH 29.0 pg      MCHC 33.5 g/dL      RDW 13 %      MPV 9.6 fL      Neutrophils 72.0 %      Lymphocytes Automated 21.2 %  Monocytes 6.0 %      Eosinophils Automated 0.1 %      Basophils Automated 0.3 %      Immature Granulocyte 0.4 %      Nucleated RBC 0.0 /100 WBC      Neutrophils Absolute 5.76 x10 3/uL      Abs Lymph Automated 1.69 x10 3/uL      Abs Mono Automated 0.48 x10 3/uL      Abs Eos Automated 0.01 x10 3/uL      Absolute Baso Automated 0.02 x10 3/uL      Absolute Immature Granulocyte 0.03 x10 3/uL      Absolute NRBC 0.00 x10 3/uL               Radiology Results:      No orders to display               Attending Attestation:      The patient was seen and examined by the mid-level (physician's assistant or nurse practitioner), or fellow, and the plan of care was discussed with me. I agree with the plan as it was presented to me.  I have reviewed and agree with the final ED diagnosis.                Scribe Attestation:      Careers adviser  I was acting as a Neurosurgeon for Leane Call, MD on [employer]  Treatment Team: Scribe: Valrie Hart    I am the first provider for this patient and I personally performed the services documented. Treatment Team: Scribe: Valrie Hart is scribing for me on [facility] M. This note accurately reflects work and decisions made by me.  Leane Call, MD                Leane Call, MD  03/31/16 [phone]

## 2016-03-31 NOTE — Progress Notes (Addendum)
1430--paged Dr. Barnie Del.    1500--voicemail left for Dr. Barnie Del.    1522--pt accepted to [facility] by Dr. Barnie Del, who requests synthetic THC level be added on to pt's labs. PA Eric aware. MHT Baldo Ash to get voluntary form signed. Notified Randa Evens on of pending admission at 1524.

## 2016-03-31 NOTE — ED Provider Notes (Signed)
[facility] EMERGENCY DEPARTMENT APP H&P         CLINICAL SUMMARY          Diagnosis:    .     Final diagnoses:   Psychosis, unspecified psychosis type   Hallucinations         MDM Notes:        MDM  Number of Diagnoses or Management Options  Hallucinations: new, needed workup  Psychosis, unspecified psychosis type: new, needed workup     Amount and/or Complexity of Data Reviewed  Clinical lab tests: ordered and reviewed  Discussion of test results with the performing providers: yes  Discuss the patient with other providers: yes  Independent visualization of images, tracings, or specimens: yes        Disposition:       ED Disposition     ED Disposition Condition Date/Time Comment    Admit  Tue Mar 31, 2016  3:31 PM Admitting Physician: Dierdre Searles [[id number]]   Diagnosis: Psychosis [784696]   Estimated Length of Stay: > or = to 2 midnights   Tentative Discharge Plan?: Home or Self Care [1]   Patient Class: Inpatient [101]               Discharge         Discharge Prescriptions     None                          CLINICAL INFORMATION        HPI:      Chief Complaint: Hallucinations  .    [name] is a 25 y.o. male h/o bipolar BIBA who presents with sudden auditory hallucinations and suicidal ideations. Pt reports hearing voices that someone is coming after him to kill him for doing something in the past. He also hears voices telling him to kill himself by jumping off stairs or a building and over things. The voices are taunting him and saying, "you got the wrong clothes and the wrong idea". Pt reports running to store to call 911 after hearing the voices. States that he has never heard similar voices telling him to jump off of things previously. He also states that he feels a hot sensation in his skin causing him to scratch it.    Not followed by a psychiatrist.  Not taking any medications.  O/w healthy. No other medical concerns.  Denies any rash.  Denies ETOH/tobacco/substance  use.  Pt is voluntary for admission.       History obtained from: patient, review of prior chart  Caveat: HPI caveat invoked due to patient's mental status.      ROS:      Caveat: Unable to complete ROS due to patient mental status      Physical Exam:      Pulse 79  BP 135/71  Resp 16  SpO2 99 %  Temp 98.4 F (36.9 C)    Physical Exam   Constitutional: He is oriented to person, place, and time. He appears well-developed and well-nourished. No distress.   HENT:   Head: Normocephalic and atraumatic.   Right Ear: External ear normal.   Left Ear: External ear normal.   Eyes: Conjunctivae and EOM are normal. Pupils are equal, round, and reactive to light.   Cardiovascular: Normal rate, regular rhythm and normal heart sounds.  Exam reveals no gallop and no friction rub.    No murmur heard.  Pulmonary/Chest: Effort  normal. No respiratory distress. He has no wheezes. He has no rales.   Neurological: He is alert and oriented to person, place, and time. Coordination normal.   Skin: Skin is warm and dry. No rash noted. He is not diaphoretic. No erythema. No pallor.   Psychiatric: Judgment normal. His mood appears anxious. His speech is not slurred. He is agitated and actively hallucinating. Cognition and memory are normal. He expresses suicidal ideation. He is communicative. He is attentive.   Nursing note and vitals reviewed.                PAST HISTORY        Primary Care Provider: Christa See, MD        PMH/PSH:    .     History reviewed. No pertinent past medical history.    He has no past surgical history on file.      Social/Family History:      He reports that he has never smoked. He has never used smokeless tobacco. He reports that he does not drink alcohol or use drugs.    History reviewed. No pertinent family history.      Listed Medications on Arrival:    .     Current Discharge Medication List      CONTINUE these medications which have NOT CHANGED    Details   ibuprofen (ADVIL,MOTRIN) 600 MG tablet Take  1 tablet (600 mg total) by mouth every 6 (six) hours as needed for Pain or Fever.  Qty: 20 tablet, Refills: 0            Allergies: He has No Known Allergies.            VISIT INFORMATION        Clinical Course in the ED:            Medications Given in the ED:    .     ED Medication Orders     Start Ordered     Status Ordering Provider    03/31/16 1451 03/31/16 1450  LORazepam (ATIVAN) tablet 2 mg  Once     Route: Oral  Ordered Dose: 2 mg     Last MAR action:  Given Tocara Mennen L            Procedures:      Procedures      Interpretations:    O2 sat-           saturation: 99 %; Oxygen use: room air; Interpretation: Normal       EKG Interpretation:  Rate: 83; Rhythm: normal  No Bundle Branch Blocks;  Normal Axis; No acute ST depressions or elevations.  Interpretation by me; Scarlette Shorts PA-C            RESULTS        Lab Results:      Results     Procedure Component Value Units Date/Time    TSH [[id number]] Collected:  03/31/16 1345    Specimen:  Blood Updated:  03/31/16 1436     Thyroid Stimulating Hormone 1.81 uIU/mL     Rapid drug screen, urine [[id number]] Collected:  03/31/16 1350    Specimen:  Urine Updated:  03/31/16 1427     Amphetamine Screen, UR Negative     Barbiturate Screen, UR Negative     Benzodiazepine Screen, UR Negative     Cannabinoid Screen, UR Negative     Cocaine, UR Negative     Opiate Screen,  UR Negative     PCP Screen, UR Negative    UA, Reflex to Microscopic (pts  3 + yrs) [[id number]] Collected:  03/31/16 1350    Specimen:  Urine Updated:  03/31/16 1422     Urine Type Clean Catch     Color, UA Yellow     Clarity, UA Clear     Specific Gravity UA 1.017     Urine pH 7.0     Leukocyte Esterase, UA Negative     Nitrite, UA Negative     Protein, UR Negative     Glucose, UA Negative     Ketones UA Negative     Urobilinogen, UA Normal mg/dL      Bilirubin, UA Negative     Blood, UA Negative    Salicylate Level [[id number]]  (Abnormal) Collected:  03/31/16 1345    Specimen:  Blood Updated:  03/31/16  1416     Salicylate Level <5.0 (L) mg/dL     GFR [[id number]] Collected:  03/31/16 1345     Updated:  03/31/16 1416     EGFR >60.0    Basic Metabolic Panel (BMP) [[id number]] Collected:  03/31/16 1345    Specimen:  Blood Updated:  03/31/16 1416     Glucose 92 mg/dL      BUN 38.7 mg/dL      Creatinine 1.1 mg/dL      Calcium 9.8 mg/dL      Sodium 564 mEq/L      Potassium 4.1 mEq/L      Chloride 101 mEq/L      CO2 26 mEq/L     Acetaminophen Level [[id number]]  (Abnormal) Collected:  03/31/16 1345    Specimen:  Blood Updated:  03/31/16 1416     Acetaminophen Level <7 (L) ug/mL     Ethanol (ALCOHOL) Level [[id number]] Collected:  03/31/16 1345    Specimen:  Blood Updated:  03/31/16 1416     Alcohol None Detected mg/dL     CBC with differential [[id number]] Collected:  03/31/16 1259    Specimen:  Blood from Blood Updated:  03/31/16 1309     WBC 7.99 x10 3/uL      Hgb 15.4 g/dL      Hematocrit 73.2 %      Platelets 211 x10 3/uL      RBC 5.31 x10 6/uL      MCV 86.6 fL      MCH 29.0 pg      MCHC 33.5 g/dL      RDW 13 %      MPV 9.6 fL      Neutrophils 72.0 %      Lymphocytes Automated 21.2 %      Monocytes 6.0 %      Eosinophils Automated 0.1 %      Basophils Automated 0.3 %      Immature Granulocyte 0.4 %      Nucleated RBC 0.0 /100 WBC      Neutrophils Absolute 5.76 x10 3/uL      Abs Lymph Automated 1.69 x10 3/uL      Abs Mono Automated 0.48 x10 3/uL      Abs Eos Automated 0.01 x10 3/uL      Absolute Baso Automated 0.02 x10 3/uL      Absolute Immature Granulocyte 0.03 x10 3/uL      Absolute NRBC 0.00 x10 3/uL               Radiology Results:  No orders to display               Scribe Attestation:      I was acting as a Neurosurgeon for [employer], PA on [employer]   Treatment Team: Scribe; Ahmed Kotb    I am the first provider for this patient and I personally performed the services documented. Treatment Team: Scribe; Ahmed Kotb is scribing for me on [facility] M. This note accurately reflects work and decisions  made by me.   Erick Blinks, PA                              Erick Blinks, PA  03/31/16 1746       Erick Blinks, PA  03/31/16 [phone]

## 2016-03-31 NOTE — Plan of Care (Signed)
Problem: Thought Disorder  Goal: Verbalizes reduction in hallucinations/delusions  Outcome: Not Progressing  Pt is positive for AH but voces are decreased in intensity; denies SI/HI; feels safe on the unit. Has been isolative to his room mostly sleeping. Cooperative, medcompliant. Will continue to monitor.

## 2016-03-31 NOTE — Psych Admission Note (Signed)
Nurse Admission Note:    Introduction: [name] is a 25 y.o. African American Male     Legal Status: voluntary    Situation/ Reason for Admission: Patient was admitted from ED related to sudden onset of command auditory hallucinations telling him to harm himself and other "negative thoughts". He reports feeling sad and depressed.    Patient Goal this Admission: "To stop hearing the voices"    Medical Issues/ Lab Considerations/ Pain: Patient states no medical problems or pain upon admission    On Admission:     Presentation on Admission: Alert and oriented    Immediate Safety Concerns on Admission :  Suicicdal Ideation      Mental Status Exam:    Sensorium- alert and oriented to person, place, time and situation    Affect and Mood- Affect is constricted and mood is depressed    Thought Process and Content- Logical and Goal directed    Insight and Judgement- Fair insight and judgement    Energy Level and Sleep - Patient complains of fatigue and poor sleep pattern    ADL's (Appearance, Appetite, Hygiene) - Patient dressed in hospital gowns. He has no clothing. He has good appetite.    Appetite - Appetite is reported to be intact, with no significant changes in weight.    Admission Falls Score:        The patient does not have a history of falls.  I did  complete a risk assessment for falls.  A Plan of Care for falls was documented.        Behavioral Health History:     Diagnosis: psychosis NOS    Suicide Profile: Level Low (Rationale for decision, ie. TSAR/SAT assessment)    Violence Profile: denies currently    Substance Abuse History: None    Current BH services: none    Medical History:  Patient denies a significant medical history prior to admission    Current Medication Prior to Admission:   Patient denies taking medications prior to admission    The patient was oriented to staff and unit function. Visiting policies and hours, contraband prohibitions and meals times reviewed. Privacy policies, patient  rights and smoking policy  were reviewed with the patient.  All treatment modalities reviewed with the patient including possible  seclusion and restraint. The patient was given a tour of the unit. The patient's belongings were searched. The patient was searched and skin assessed. Skin assessment was not remarkable. Behavioral expectation were reviewed.  The patient was given a blue welcome folder.        TDO process was not reviewed with the patient.     Flu vaccine was not ordered because patient refused    [name] is not a current smoker.        Smoking cessation education and medication was not ordered .   Smoking interventions are not indicated and will be offered.    The patient did not Sign all admission papers.

## 2016-04-01 LAB — ECG 12-LEAD
Atrial Rate: 83 {beats}/min
P Axis: 52 degrees
P-R Interval: 130 ms
Q-T Interval: 350 ms
QRS Duration: 96 ms
QTC Calculation (Bezet): 411 ms
R Axis: 72 degrees
T Axis: 18 degrees
Ventricular Rate: 83 {beats}/min

## 2016-04-01 LAB — FOLATE: Folate: 12.5 ng/mL

## 2016-04-01 LAB — LIPID PANEL
Cholesterol / HDL Ratio: 3.2
Cholesterol: 163 mg/dL (ref 0–199)
HDL: 51 mg/dL (ref 40–9999)
LDL Calculated: 100 mg/dL — AB (ref 0–99)
Triglycerides: 62 mg/dL (ref 34–149)
VLDL Calculated: 12 mg/dL (ref 10–40)

## 2016-04-01 LAB — HEMOGLOBIN A1C
Average Estimated Glucose: 108.3 mg/dL
Hemoglobin A1C: 5.4 % (ref 4.6–5.9)

## 2016-04-01 LAB — VITAMIN B12: Vitamin B-12: 267 pg/mL (ref 211–911)

## 2016-04-01 LAB — HEMOLYSIS INDEX: Hemolysis Index: 5 (ref 0–18)

## 2016-04-01 MED ORDER — DULOXETINE HCL 20 MG PO CPEP
20.0000 mg | ORAL_CAPSULE | Freq: Once | ORAL | Status: AC
Start: 2016-04-01 — End: 2016-04-01
  Administered 2016-04-01: 20 mg via ORAL
  Filled 2016-04-01: qty 1

## 2016-04-01 MED ORDER — RISPERIDONE 2 MG PO TBDP
2.0000 mg | ORAL_TABLET | Freq: Every evening | ORAL | Status: DC
Start: 2016-04-01 — End: 2016-04-03
  Administered 2016-04-01 – 2016-04-02 (×2): 2 mg via ORAL
  Filled 2016-04-01 (×2): qty 1

## 2016-04-01 MED ORDER — RISPERIDONE 1 MG PO TBDP
1.0000 mg | ORAL_TABLET | Freq: Every day | ORAL | Status: DC
Start: 2016-04-01 — End: 2016-04-08
  Administered 2016-04-01 – 2016-04-08 (×8): 1 mg via ORAL
  Filled 2016-04-01 (×9): qty 1

## 2016-04-01 MED ORDER — DULOXETINE HCL 20 MG PO CPEP
40.0000 mg | ORAL_CAPSULE | Freq: Every day | ORAL | Status: DC
Start: 2016-04-02 — End: 2016-04-08
  Administered 2016-04-02 – 2016-04-08 (×7): 40 mg via ORAL
  Filled 2016-04-01 (×8): qty 2

## 2016-04-01 MED ORDER — CLONAZEPAM 0.5 MG PO TABS
0.5000 mg | ORAL_TABLET | Freq: Two times a day (BID) | ORAL | Status: DC
Start: 2016-04-01 — End: 2016-04-08
  Administered 2016-04-01 – 2016-04-08 (×14): 0.5 mg via ORAL
  Filled 2016-04-01 (×14): qty 1

## 2016-04-01 NOTE — Progress Notes (Signed)
Patient slept through the night. Safety was maintained and no distressed noted. Will continue to monitor and be available.

## 2016-04-01 NOTE — Plan of Care (Signed)
Problem: Loss of functioning (Thought Disorder, Mood Disturbance and/or Severe Anxiety) AS EVIDENCED BY...  Goal: Attends a minimum number of therapies daily  Outcome: Progressing  MENTAL HEALTH THERAPY ASSESSMENT/PROGRESS NOTE      Affect/Mood:  Constricted    Thought Process:  Goal Directed    Thought Content:  Within Normal Limits    Interpersonal:  Provided Feedback and Guarded    LEARNING INTERVENTIONS:    Identification of Feelings  /Problem Solving Skills :   Partially Achieved    Coping Stategies:  Partially Achieved     Patient presented with a constricted affect and a guarded mood. He has attended two groups so far today. Patient was quiet and guarded initially, but relaxed and shared some information with his peers eventually. In group therapy discussion on communicating, patient shared that he often hear voices, which can keep him from communicating with others. He reported that the voices usually tell him that he is no good. Other patient reassured him that medication can help with this problem. Will continue to monitor, assess and encourage group attendance.

## 2016-04-01 NOTE — Treatment Plan (Signed)
Interdisciplinary Treatment Plan Update Meeting    04/01/2016  [name]    Participants:  Patient:  [name]  Attending Physician:  Nancy Fetter, MD  RN: Kelby Aline, RN  Other: Marylyn Ishihara, MD    Objective:  Review response to treatment, reassess needs/goals, update plan as indicated incorporating patient's strengths and stated needs, goals, and preferences.    1. Summary of Patient Progress on Treatment Plan Goals:  [name] participated in Interdisciplinary Team Treatment Meeting this afternoon reporting he started hearing voices and seeing faces/symbols yesterday. He stated "The voices say I am not adequate enough. I am not worth anything". He also reported this morning that "the voices" were telling him to harm himself. She denies wanting to harm himself at this time. He spoke about recently living with his sister in [place] for the past year but is homeless at this time. Plan: Discontinue Zyprexa; Start on Klonopin 0.5 mg twice daily, Risperidone 1 mg daily, and Cymbalta 20 mg daily.    2. Level of Patient Involvement:  Contributing    3. Patient Understanding of Plan of Care:  Concrete understanding of primary goal/interventions    4. Level of Agreement/Commitment to Plan of Care:  Agrees with plan of care          Contributor Signatures:      MD_________________________________ Date___________________    SW_________________________________Date ___________________    RN _________________________________Date____________________    Other________________________________Date ___________________    (This document is signed electronically by Clinical research associate and electronic co-signer.  Other participants sign a printed copy which is scanned into the EMR)

## 2016-04-01 NOTE — Plan of Care (Signed)
Problem: Loss of functioning (Thought Disorder, Mood Disturbance and/or Severe Anxiety) AS EVIDENCED BY...  Goal: Completes discharge safety and recovery plan  Outcome: Progressing  Met w/ pt  Pt is homeless and does not have any outpatient services.   Will continue to monitor and assess for services
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
## 2016-04-01 NOTE — Plan of Care (Deleted)
Problem: Loss of functioning (Thought Disorder, Mood Disturbance and/or Severe Anxiety) AS EVIDENCED BY...  Goal: Attends a minimum number of therapies daily  Outcome: Progressing  MENTAL HEALTH THERAPY ASSESSMENT/PROGRESS NOTE      Affect/Mood:  Appropriate    Thought Process:  Goal Directed    Thought Content:  Within Normal Limits    Interpersonal:  Provided Feedback    LEARNING INTERVENTIONS:    Identification of Feelings  /Problem Solving Skills :   Partially Achieved    Coping Stategies:  Partially Achieved     Patient presented with an appropriate affect and an even mood. He has attended two groups so far today. Patient has made an effot totry to remain calm and to be less intrusive with other patients. He was engaged with other patients in groups. Will continue to monitor, assess and encourage group attendance.

## 2016-04-01 NOTE — Plan of Care (Signed)
Problem: Loss of functioning (Thought Disorder, Mood Disturbance and/or Severe Anxiety) AS EVIDENCED BY...  Goal: Attends a minimum number of therapies daily  Outcome: Progressing  Daily Group Attendance Note:      Orientation and Goals Group:Attended Yes      Group Therapy:Attended Yes      Creative Therapy:Attended No      Specialty Group:Attended No      Recreation Therapy:Attended No      Evening Group:Attended No      Wrap Group:Attended No

## 2016-04-02 NOTE — Plan of Care (Signed)
Problem: Psychosocial and Spiritual Needs  Goal: Demonstrates ability to cope with hospitalization/illness  Outcome: Progressing   04/02/16 1458   Goal/Interventions addressed this shift   Demonstrates ability to cope with hospitalizations/illness Encourage verbalization of feelings/concerns/expectations;Encourage participation in diversional activity     [name] admits to auditory hallucinations stating "They are about the same, maybe a little better". He reports hallucinations or continuing to state "negative things" and telling him to harm himself. He has been visible on the unit and attended group sessions. He is guarded in conversation.

## 2016-04-02 NOTE — Progress Notes (Signed)
Patient is isolative and withdrawn. Blunted affect but denies SI/HI. Compliant with scheduled medications. No acute distress noted and slept.

## 2016-04-02 NOTE — Plan of Care (Signed)
Problem: Loss of functioning (Thought Disorder, Mood Disturbance and/or Severe Anxiety) AS EVIDENCED BY...  Goal: Attends a minimum number of therapies daily  Outcome: Progressing  Daily Group Attendance Note:      Orientation and Goals Group:Attended No      Group Therapy:Attended Yes      Creative Therapy:Attended No      Specialty Group:Attended No      Recreation Therapy:Attended No      Evening Group:Attended No      Wrap Group:Attended No

## 2016-04-02 NOTE — Plan of Care (Signed)
Problem: Thought Disorder  Goal: Verbalizes reduction in hallucinations/delusions  Outcome: Not Progressing   04/02/16 2036   Goal/Interventions addressed this shift   Verbalizes reduction in hallucinations/delusions  Monitor the presence of hallucinations/delusions every shift;Reality test with patient if patient able to tolerate       Patient is primarily isolative to room, pleasant and cooperative.  He states the auditory hallucinations continue to be uncomfortable for him. He took PRN zyprexa to good effect.

## 2016-04-02 NOTE — Plan of Care (Signed)
Problem: Loss of functioning (Thought Disorder, Mood Disturbance and/or Severe Anxiety) AS EVIDENCED BY...  Goal: Attends a minimum number of therapies daily  Outcome: Progressing  MENTAL HEALTH THERAPY ASSESSMENT/PROGRESS NOTE      Affect/Mood:  Blunted    Thought Process:  Blocking    Thought Content:  Delusional    Interpersonal:  Guarded    LEARNING INTERVENTIONS:    Identification of Feelings  /Problem Solving Skills :  None achieved    Coping Stategies: none achieved    Pt presented with guarded mood and affect. He was thought blocked and internally preoccupied. Pt spoke in a very low tone when he spoke at all, Will continue to monitor, assess and encourage group attendance.

## 2016-04-03 LAB — HEPATIC FUNCTION PANEL
ALT: 14 U/L (ref 0–55)
AST (SGOT): 15 U/L (ref 5–34)
Albumin/Globulin Ratio: 1.1 (ref 0.9–2.2)
Albumin: 3.8 g/dL (ref 3.5–5.0)
Alkaline Phosphatase: 67 U/L (ref 38–106)
Bilirubin Direct: 0.1 mg/dL (ref 0.0–0.5)
Bilirubin Indirect: 0.2 mg/dL (ref 0.0–1.0)
Bilirubin, Total: 0.3 mg/dL (ref 0.1–1.2)
Globulin: 3.4 g/dL (ref 2.0–3.7)
Protein, Total: 7.2 g/dL (ref 6.0–8.3)

## 2016-04-03 LAB — HEMOLYSIS INDEX: Hemolysis Index: 4 (ref 0–18)

## 2016-04-03 LAB — T4, FREE: T4 Free: 0.82 ng/dL (ref 0.70–1.48)

## 2016-04-03 LAB — HIV AG/AB 4TH GENERATION: HIV Ag/Ab, 4th Generation: NONREACTIVE

## 2016-04-03 MED ORDER — RISPERIDONE 2 MG PO TBDP
3.0000 mg | ORAL_TABLET | Freq: Every evening | ORAL | Status: DC
Start: 2016-04-03 — End: 2016-04-08
  Administered 2016-04-03 – 2016-04-07 (×5): 3 mg via ORAL
  Filled 2016-04-03 (×11): qty 1

## 2016-04-03 NOTE — Progress Notes (Signed)
Psychiatry Progress Note  (Level 1 = Problem Focused, Level 2 = Expanded Problem Focused, Level 3 = Detailed)    Patient Name: [name]            Current Date/Time:  12/22/20173:40 PM  MRN:  [id number]                            Attending Physician: Nancy Fetter, MD  DOB: 1990/12/22                                Gender: male                              Late entry for April 02, 2016      I. History   Informants:   Chart, patient    A. Chief Complaint or Reason for Admission    CC:  " I am still hearing voices saying mean things about me.         B.History of Present Illness: Interval History     (Symptoms and qualifiers:1 for Level 1, 2 for Level 2 and 3+ for Level 3)    Patient is a 25 y.o. single, homeless, unemployed AA male with new onset (one month ago) worsening voices and depression for several months, admitted completely naked, brought to the ER by ambulance from a store where he took off all his clothes because the voices were saying:  "You are wearing the wrong clothes:.    On exam today he is alert and cooperative.  Still very depressed and the voices are still bothering him.  He still has suicidal thoughts and is very depressed.  No HI.  He stated he feels anxious here on the unit around all these other people.     C.Medical History  Review of Systems  (Level 1 is none, Level 2 is 1, and Level 3 is 2+)  A complete 14 point ROS was done   Psychiatric: Depression, Anxiety and Psychosis  Constitutional: No complaints    D.Additional Past Psychiatric, Substance Use, Medical, Family and Social History  Psychiatric: No new information available as compared to previous encounter(s)  Substance Use: No new information available as compared to previous encounter(s)  Medical: No new information available as compared to previous encounter(s)  Family: No new information available as compared to previous encounter(s)  Social: No new information available as compared to previous  encounter(s)    Medications:   Current Medications    Scheduled     Medication Dose/Rate, Route, Frequency Last Action    clonazePAM (KlonoPIN) tablet 0.5 mg 0.5 mg, PO, BID Given: 12/22 0828    DULoxetine (CYMBALTA) DR capsule 40 mg 40 mg, PO, Daily Given: 12/22 0828    nicotine (NICODERM CQ) 21 MG/24HR patch 1 patch 1 patch, TD, Daily Ordered    risperiDONE (RisperDAL M-TABS) disintegrating tablet 1 mg 1 mg, PO, Daily Given: 12/22 5409    risperiDONE (RisperDAL M-TABS) disintegrating tablet 3 mg 3 mg, PO, QHS Ordered         PRN     Medication Dose/Rate, Route, Frequency Last Action    acetaminophen (TYLENOL) tablet 650 mg 650 mg, PO, Q6H PRN Ordered    benztropine (COGENTIN) tablet 2 mg 2 mg, PO, Q4H PRN Ordered    benztropine mesylate (COGENTIN) injection 2  mg 2 mg, IM, Q4H PRN Ordered    diphenhydrAMINE (BENADRYL) capsule 50 mg 50 mg, PO, Q6H PRN Ordered    diphenhydrAMINE (BENADRYL) injection 50 mg 50 mg, IM, Q6H PRN Ordered    haloperidol (HALDOL) tablet 5 mg 5 mg, PO, Q6H PRN Ordered    haloperidol lactate (HALDOL) injection 5 mg No Dose/Rate, IM, Q6H PRN See Alternative: 12/21 1936    ibuprofen (ADVIL,MOTRIN) tablet 600 mg 600 mg, PO, Q6H PRN Ordered    LORazepam (ATIVAN) injection 2 mg 2 mg, IM, Q6H PRN Ordered    LORazepam (ATIVAN) tablet 2 mg 2 mg, PO, Q6H PRN Ordered    nicotine polacrilex (NICORETTE) gum 2 mg 2 mg, BU, Q1H PRN Ordered    OLANZapine (ZyPREXA) injection 10 mg No Dose/Rate, IM, Q6H PRN See Alternative: 12/21 1936    OLANZapine (ZyPREXA) tablet 10 mg 10 mg, PO, Q6H PRN Given: 12/21 1936    zolpidem (AMBIEN) tablet 5 mg 5 mg, PO, QHS PRN Ordered                II. Examination   Vital signs reviewed:   Blood pressure 126/82, pulse 99, temperature 97.7 F (36.5 C), temperature source Oral, resp. rate 16, height 1.676 m (5\' 6" ), weight 75.8 kg (167 lb), SpO2 96 %.     Mental Status Exam  (Level 1 is 1-5, Level 2 is 6-8, Level 3 is 9+)  General appearance: Appears chronological age and Good  hygiene  Attitude/Behavior: Calm, Cooperative and Eye Contact is  Good  Motor: Psychomotor retardation  Gait: No obvious abnormalities  Muscle strength and tone: Grossly intact  Speech:   Spontaneous: No  Rate and Rhythm: Normal  Volume: Soft  Mood: depressed, sad  Affect:   Range: Blunt  Thought Process:   Coherent: Yes  Associations: Goal-directed  Thought Content:   Delusions:  Yes  Depressive Cognitions:  Hopeless, Helpless, Worthless and Guilt  Suicidal:  Suicidal thoughts  Homicidal:  No homicidal thoughts  Violent Thoughts:  No  Perceptions:   Hallucinations: Yes, auditory  Insight: Poor  Judgment: Poor  Cognition:   Level of Consciousness: Intact  Orientation: Intact to self, place and time    Psychiatric / Cognitive Instruments: None    Pertinent Physical Exam: Not relevant to current chief complaint/reason for admission    Imaging / EKG / Labs:   Labs in the last 24 hours  Results     Procedure Component Value Units Date/Time    HIV Ag/Ab 4th generation [[id number]] Collected:  04/03/16 1705     Updated:  04/03/16 2114     HIV Ag/Ab, 4th Generation Non-Reactive    T4, free [[id number]] Collected:  04/03/16 1705    Specimen:  Blood Updated:  04/03/16 2113     T4 Free 0.82 ng/dL     Hepatic function panel (LFT) [[id number]] Collected:  04/03/16 1705    Specimen:  Blood Updated:  04/03/16 2053     Bilirubin, Total 0.3 mg/dL      Bilirubin, Direct 0.1 mg/dL      Bilirubin, Indirect 0.2 mg/dL      AST (SGOT) 15 U/L      ALT 14 U/L      Alkaline Phosphatase 67 U/L      Protein, Total 7.2 g/dL      Albumin 3.8 g/dL      Globulin 3.4 g/dL      Albumin/Globulin Ratio 1.1    Hemolysis index [[id number]] Collected:  04/03/16 1705  Updated:  04/03/16 2053     Hemolysis Index 4    RPR (Reflex to Titer and Confirmation) [[id number]] Collected:  04/03/16 1705     Updated:  04/03/16 2039        EKG Results   Cardiology Results     Procedure Component Value Units Date/Time    ECG 12 lead [[id number]] Collected:  03/31/16 1239      Updated:  04/01/16 1421     Ventricular Rate 83 BPM      Atrial Rate 83 BPM      P-R Interval 130 ms      QRS Duration 96 ms      Q-T Interval 350 ms      QTC Calculation (Bezet) 411 ms      P Axis 52 degrees      R Axis 72 degrees      T Axis 18 degrees     Narrative:       NORMAL SINUS RHYTHM  NORMAL ECG  NO PREVIOUS ECGS AVAILABLE  Confirmed by Rayburn Go MD, PAMELA ([id number]) on 04/01/2016 2:21:23 P,    EKG SCAN [[id number]] Resulted:  04/03/16 1435     Updated:  04/03/16 1435    EKG SCAN [[id number]] Resulted:  04/01/16 1421     Updated:  04/01/16 1421            III. Assessment and Plan (Medical Decision Making)     1. I certify that this patient continues to require inpatient hospitalization due to acute risk to self    2. Psychiatric Diagnoses   Axis I    Major Depressive Disorder, Recurrent Episode, Severe, with Psychotic Features, with Anxious Distress    Axis II    Deferred   Axis III  History reviewed. No pertinent past medical history.   Axis IV   Problems with primary support group   Occupational problems   Housing problems   Economic problems   Axis V   Current GAF: 21-30: behavior considerably influenced by delusions or hallucinations OR serious impairment in judgment, communication OR inability to function in almost all areas.    3.All diagnostic procedures completed since admission were reviewed. Past medical records reviewed. Coordination of care was discussed with inpatient team and as available with the outpatient team.    4. On this admission patient educated about and provided input into their treatment plan.  Patient understands potential risks and benefits of proposed treatment plan.     5.  Assessment / Impression  Patient is a 25 y.o.single, AA male with new onset severe depression with psychosis, admitted for bizarre behavior (taking off all his clothes in public, and suicidal thoughts.     Suicide Risk Assessment   Suicide Risk Assessment performed? Yes: Suicide Thoughts /  Behaviors: Yes    Plan / Recommendations:    Biological Plan:  Medications: have begun Cymbalta and Risperdal  Medical Work-up: None required at this time  Consults: None required at this time    Psychosocial Plan:  Individual therapy: Psycho-education and psychotherapy of the following modalities will continue to be provided during daily visits:Supportive  Group and milieu therapies: daily per unit's schedule.  Continue with Social Work intervention provided for discharge planning including assistance with follow-up psychiatric care.    Plan for Family Involvement: None Available----refuses to allow [place] to call his sister or grandmother  Other Providers Contact Information and Dates Contacted: No Updates    Total Attending time spent 35 minutes (floor time) with  more than 50 percent of time in direct patient contact, coordinating care and counseling.    Signed by: Nancy Fetter, MD  04/03/2016  3:40 PM

## 2016-04-03 NOTE — Plan of Care (Signed)
Problem: Loss of functioning (Thought Disorder, Mood Disturbance and/or Severe Anxiety) AS EVIDENCED BY...  Goal: Attends a minimum number of therapies daily  Outcome: Progressing  Daily Group Attendance Note:      Orientation and Goals Group:Attended No      Group Therapy:Attended Yes      Creative Therapy:Attended No      Specialty Group:Attended No      Recreation Therapy:Attended N/A      Evening Group:Attended No      Wrap Group:Attended N/A

## 2016-04-03 NOTE — UM Notes (Signed)
Other Psychotic Disorders: Inpatient Care - Care Day 4 (04/03/2016)        Continued stay review for 04/03/16      Therapist plan of care note:    Problem: Loss of functioning (Thought Disorder, Mood Disturbance and/or Severe Anxiety) AS EVIDENCED BY...  Goal: Attends a minimum number of therapies daily  Outcome: Progressing    AFFECT/MOOD:  Anxious    THOUGHT PROCESS:  Goal Directed and Concrete    THOUGHT CONTENT:  Delusional    INTERPERSONAL:  Discussed Issues, Attentive, Guarded and Limited Insight    LEARNING INTERVENTIONS:    IDENTIFICATION OF FEELINGS  /PROBLEM SOLVING SKILLS:   Partially Achieved    COPING STRATEGIES:  Partially Achieved     Pt seems to have even mood and is quiet/attentive in Group Therapy. It's difficult to determine if he's quiet cause he's hallucinating, or confused. Pt said he's feeling anxious, yet when asked what he's afraid of Pt states he "doesn't know". Pt said he is grateful for the hospital.Will continue to encourage group attendance and participation in order to increase positive coping skills and express feelings regarding current situation.    ____________________________________  Zenia Resides, RN BSN  UR Case Manager  Case Management Department  [facility]  [phone]

## 2016-04-03 NOTE — Plan of Care (Signed)
Problem: Thought Disorder  Goal: Verbalizes reduction in hallucinations/delusions  Outcome: Not Progressing   04/03/16 2336   Goal/Interventions addressed this shift   Verbalizes reduction in hallucinations/delusions  Monitor the presence of hallucinations/delusions every shift;Reality test with patient if patient able to tolerate       Voices continue to distress patient.  PRN given to good effect.

## 2016-04-03 NOTE — Progress Notes (Signed)
Patient maintained safety.

## 2016-04-03 NOTE — Plan of Care (Signed)
Problem: Loss of functioning (Thought Disorder, Mood Disturbance and/or Severe Anxiety) AS EVIDENCED BY...  Goal: Attends a minimum number of therapies daily    Intervention: Assist patient in developing a safety plan  Mental Health Therapy Progress     04/03/16 Patient unable to meet today. Plan to attempt to meet with the  patient tomorrow to complete the inpatient safety plan.

## 2016-04-03 NOTE — Plan of Care (Cosign Needed)
Interdisciplinary Treatment Plan Update Meeting    04/03/2016  [name]    Participants:  Patient:  [name]  Attending Physician:  Nancy Fetter, MD  RN: Leonia Reader    Objective:  Review response to treatment, reassess needs/goals, update plan as indicated incorporating patient's strengths and stated needs, goals, and preferences.    1. Summary of Patient Progress on Treatment Plan Goals:  Improved mood today more verbal and oriented. Continues to deny any suicide thoughts or plans, however, continues to report the presence of voices, auditory hallucinations, "they told me I don't fit, and I am going to die, maybe soon". Taking medications however described that they were helping, but not fast enough. Plan: Increase Risperdal PO, patient verbalized agreement with plan. [name] reports that the goal for today 04/03/2016 is to meet with my physician, ask questions about my medications and go to at least one group    2. Level of Patient Involvement:  Impaired thought process    3. Patient Understanding of Plan of Care:  Limited understanding due to impaired thought process    4. Level of Agreement/Commitment to Plan of Care:  Ambivalent about plan of care          Contributor Signatures:      MD_________________________________ Date___________________    SW_________________________________Date ___________________    RN _________________________________Date____________________    Other________________________________Date ___________________    (This document is signed electronically by Clinical research associate and electronic co-signer.  Other participants sign a printed copy which is scanned into the EMR)

## 2016-04-03 NOTE — Plan of Care (Signed)
Problem: Loss of functioning (Thought Disorder, Mood Disturbance and/or Severe Anxiety) AS EVIDENCED BY...  Goal: Attends a minimum number of therapies daily  Outcome: Progressing  MENTAL HEALTH THERAPY ASSESSMENT/PROGRESS NOTE      AFFECT/MOOD:  Anxious    THOUGHT PROCESS:  Goal Directed and Concrete    THOUGHT CONTENT:  Delusional    INTERPERSONAL:  Discussed Issues, Attentive, Guarded and Limited Insight    LEARNING INTERVENTIONS:    IDENTIFICATION OF FEELINGS  /PROBLEM SOLVING SKILLS:   Partially Achieved    COPING STRATEGIES:  Partially Achieved     Pt seems to have even mood and is quiet/attentive in Group Therapy. It's difficult to determine if he's quiet cause he's hallucinating, or confused. Pt said he's feeling anxious, yet when asked what he's afraid of Pt states he "doesn't know". Pt said he is grateful for the hospital.Will continue to encourage group attendance and participation in order to increase positive coping skills and express feelings regarding current situation.

## 2016-04-03 NOTE — Plan of Care (Signed)
Problem: Loss of functioning (Thought Disorder, Mood Disturbance and/or Severe Anxiety) AS EVIDENCED BY...  Goal: Patient's recovery goal in his/her own words:  Outcome: Progressing   04/03/16 1027   Goal/Interventions addressed this shift   Patient's recovery goal in his/her own words:  Assist to identify goals for treatment based on individual needs and strengths;Assist patient to define criteria for discharge   [name] reports that the goal for today 04/03/2016 is to meet with my physician, ask questions about my medications and go to at least one group

## 2016-04-04 LAB — RPR (REFLEX TO TITER AND CONFIRMATION): RPR: NONREACTIVE

## 2016-04-04 NOTE — Plan of Care (Signed)
Problem: Loss of functioning (Thought Disorder, Mood Disturbance and/or Severe Anxiety) AS EVIDENCED BY...  Goal: Attends a minimum number of therapies daily  Outcome: Progressing  MENTAL HEALTH THERAPY ASSESSMENT/PROGRESS NOTE      Affect/Mood:  Blunted    Thought Process:  Blocking    Thought Content:  Within Normal Limits    Interpersonal:  Guarded    LEARNING INTERVENTIONS:    Identification of Feelings  /Problem Solving Skills :   Partially Achieved    Coping Stategies:  Partially Achieved

## 2016-04-04 NOTE — Progress Notes (Signed)
Patient slept through the night. Safety maintained.
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
## 2016-04-04 NOTE — Plan of Care (Signed)
Problem: Thought Disorder  Goal: Verbalizes reduction in hallucinations/delusions  Outcome: Not Progressing   04/04/16 1109   Goal/Interventions addressed this shift   Verbalizes reduction in hallucinations/delusions  Monitor the presence of hallucinations/delusions every shift;Reality test with patient if patient able to tolerate     [name] denies SI/HI but endorses AH of "people telling me that someone's trying to kill me."  He says the voices are upsetting him; PRN zyprexa 10mg  given with positive effect @ 0846.  He is calm, cooperative, pleasant, and isolative to self.  Sleep and appetite are intact.  [name] reports that the goal for today 04/04/2016 is to "get better."  Safety maintained, will continue to monitor and assess.

## 2016-04-04 NOTE — Progress Notes (Signed)
Psychiatry Progress Note  (Level 1 = Problem Focused, Level 2 = Expanded Problem Focused, Level 3 = Detailed)    Patient Name: [name]            Current Date/Time:  12/23/20171:59 PM  MRN:  [id number]                            Attending Physician: Nancy Fetter, MD  DOB: Apr 05, 1991                                Gender: male                              Late entry for April 03, 2016      I. History   Informants:   Chart, patient    A. Chief Complaint or Reason for Admission    CC:  " The voices told me that my clothes were not right, that;s why I took them off.   They are saying the same thing today, that these clothes are not right".          B.History of Present Illness: Interval History     (Symptoms and qualifiers:1 for Level 1, 2 for Level 2 and 3+ for Level 3)    Patient is a 25 y.o. single, homeless, unemployed AA male with new onset (one month ago) worsening voices and depression for several months, admitted completely naked, brought to the ER by ambulance from a store where he took off all his clothes because the voices were saying:  "You are wearing the wrong clothes:.    The patient was seen during the multidisciplinary founds.  He was cooperative and pleasant.  He is still very depressed with suicidal thoughts and bad voices telling him he is no good.       C.Medical History  Review of Systems  (Level 1 is none, Level 2 is 1, and Level 3 is 2+)  A complete 14 point ROS was done   Psychiatric: Depression, Anxiety and Psychosis  Constitutional: No complaints    D.Additional Past Psychiatric, Substance Use, Medical, Family and Social History  Psychiatric: No new information available as compared to previous encounter(s)  Substance Use: No new information available as compared to previous encounter(s)  Medical: No new information available as compared to previous encounter(s)  Family: No new information available as compared to previous encounter(s)  Social: No new information available as  compared to previous encounter(s)    Medications:   Current Medications    Scheduled     Medication Dose/Rate, Route, Frequency Last Action    clonazePAM (KlonoPIN) tablet 0.5 mg 0.5 mg, PO, BID Given: 12/23 0842    DULoxetine (CYMBALTA) DR capsule 40 mg 40 mg, PO, Daily Given: 12/23 0842    nicotine (NICODERM CQ) 21 MG/24HR patch 1 patch 1 patch, TD, Daily Patch Applied: 12/23 0841    risperiDONE (RisperDAL M-TABS) disintegrating tablet 1 mg 1 mg, PO, Daily Given: 12/23 0842    risperiDONE (RisperDAL M-TABS) disintegrating tablet 3 mg 3 mg, PO, QHS Given: 12/22 2214         PRN     Medication Dose/Rate, Route, Frequency Last Action    acetaminophen (TYLENOL) tablet 650 mg 650 mg, PO, Q6H PRN Ordered    benztropine (COGENTIN) tablet 2 mg  2 mg, PO, Q4H PRN Ordered    benztropine mesylate (COGENTIN) injection 2 mg 2 mg, IM, Q4H PRN Ordered    diphenhydrAMINE (BENADRYL) capsule 50 mg 50 mg, PO, Q6H PRN Ordered    diphenhydrAMINE (BENADRYL) injection 50 mg 50 mg, IM, Q6H PRN Ordered    haloperidol (HALDOL) tablet 5 mg 5 mg, PO, Q6H PRN Given: 12/22 1556    haloperidol lactate (HALDOL) injection 5 mg No Dose/Rate, IM, Q6H PRN See Alternative: 12/22 1556    ibuprofen (ADVIL,MOTRIN) tablet 600 mg 600 mg, PO, Q6H PRN Ordered    LORazepam (ATIVAN) injection 2 mg 2 mg, IM, Q6H PRN Ordered    LORazepam (ATIVAN) tablet 2 mg 2 mg, PO, Q6H PRN Ordered    nicotine polacrilex (NICORETTE) gum 2 mg 2 mg, BU, Q1H PRN Ordered    OLANZapine (ZyPREXA) injection 10 mg No Dose/Rate, IM, Q6H PRN See Alternative: 12/23 0846    OLANZapine (ZyPREXA) tablet 10 mg 10 mg, PO, Q6H PRN Given: 12/23 0846    zolpidem (AMBIEN) tablet 5 mg 5 mg, PO, QHS PRN Ordered                II. Examination   Vital signs reviewed:   Blood pressure (!) 134/91, pulse 74, temperature 97.5 F (36.4 C), temperature source Oral, resp. rate 16, height 1.676 m (5\' 6" ), weight 75.8 kg (167 lb), SpO2 97 %.     Mental Status Exam  (Level 1 is 1-5, Level 2 is 6-8, Level 3 is  9+)  General appearance: Good grooming  Attitude/Behavior: Cooperative and Suspicious  Motor: Psychomotor retardation  Gait: No obvious abnormalities---patient believes his gait is off and that is why his wife will not let him near their baby and why he cannot get a girlfriend---however, there is no obvious problem with his gait.   Muscle strength and tone: Grossly intact  Speech:   Spontaneous: No  Rate and Rhythm: Normal  Volume: Soft  Mood: very sad  Affect:   Range: Flat  Thought Process:   Coherent: Yes  Associations: Goal-directed  Thought Content:   Delusions:  Yes  Depressive Cognitions:  Hopeless, Helpless, Worthless and Guilt  Suicidal:  Suicidal thoughts  Homicidal:  No homicidal thoughts  Violent Thoughts:  No  Perceptions:   Hallucinations: Yes, auditory  Insight: Poor  Judgment: Poor  Cognition:   Level of Consciousness: Intact  Orientation: Intact to self, place and time    Psychiatric / Cognitive Instruments: None    Pertinent Physical Exam: Not relevant to current chief complaint/reason for admission    Imaging / EKG / Labs:   Labs in the last 24 hours  Results     Procedure Component Value Units Date/Time    HIV Ag/Ab 4th generation [[id number]] Collected:  04/03/16 1705     Updated:  04/03/16 2114     HIV Ag/Ab, 4th Generation Non-Reactive    T4, free [[id number]] Collected:  04/03/16 1705    Specimen:  Blood Updated:  04/03/16 2113     T4 Free 0.82 ng/dL     Hepatic function panel (LFT) [[id number]] Collected:  04/03/16 1705    Specimen:  Blood Updated:  04/03/16 2053     Bilirubin, Total 0.3 mg/dL      Bilirubin, Direct 0.1 mg/dL      Bilirubin, Indirect 0.2 mg/dL      AST (SGOT) 15 U/L      ALT 14 U/L      Alkaline Phosphatase 67 U/L  Protein, Total 7.2 g/dL      Albumin 3.8 g/dL      Globulin 3.4 g/dL      Albumin/Globulin Ratio 1.1    Hemolysis index [[id number]] Collected:  04/03/16 1705     Updated:  04/03/16 2053     Hemolysis Index 4    RPR (Reflex to Titer and Confirmation) [[id number]]  Collected:  04/03/16 1705     Updated:  04/03/16 2039        EKG Results   Cardiology Results     Procedure Component Value Units Date/Time    ECG 12 lead [[id number]] Collected:  03/31/16 1239     Updated:  04/01/16 1421     Ventricular Rate 83 BPM      Atrial Rate 83 BPM      P-R Interval 130 ms      QRS Duration 96 ms      Q-T Interval 350 ms      QTC Calculation (Bezet) 411 ms      P Axis 52 degrees      R Axis 72 degrees      T Axis 18 degrees     Narrative:       NORMAL SINUS RHYTHM  NORMAL ECG  NO PREVIOUS ECGS AVAILABLE  Confirmed by Rayburn Go MD, PAMELA ([id number]) on 04/01/2016 2:21:23 P,    EKG SCAN [[id number]] Resulted:  04/03/16 1435     Updated:  04/03/16 1435    EKG SCAN [[id number]] Resulted:  04/01/16 1421     Updated:  04/01/16 1421            III. Assessment and Plan (Medical Decision Making)     1. I certify that this patient continues to require inpatient hospitalization due to acute risk to self    2. Psychiatric Diagnoses   Axis I    Major Depressive Disorder, Recurrent Episode, Severe, with Psychotic Features, with Anxious Distress    Axis II    Deferred   Axis III  History reviewed. No pertinent past medical history.   Axis IV   Problems with primary support group   Occupational problems   Housing problems   Economic problems   Axis V   Current GAF: 21-30: behavior considerably influenced by delusions or hallucinations OR serious impairment in judgment, communication OR inability to function in almost all areas.    3.All diagnostic procedures completed since admission were reviewed. Past medical records reviewed. Coordination of care was discussed with inpatient team and as available with the outpatient team.    4. On this admission patient educated about and provided input into their treatment plan.  Patient understands potential risks and benefits of proposed treatment plan.     5.  Assessment / Impression  Patient is a 25 y.o.single, AA male with new onset severe depression with  psychosis, admitted for bizarre behavior (taking off all his clothes in public, and suicidal thoughts.     Suicide Risk Assessment   Suicide Risk Assessment performed? Yes: Suicide Thoughts / Behaviors: Yes    Plan / Recommendations:    Biological Plan:  Medications: have begun Cymbalta,  I raised the Risperdal from 3 mg a day to 4 mg a day as the voices are still bothersome and prominent.   Medical Work-up: None required at this time  Consults: None required at this time    Psychosocial Plan:  Individual therapy: Psycho-education and psychotherapy of the following modalities will continue to be provided during daily visits:Supportive  Group and milieu therapies: daily per unit's schedule.  Continue with Social Work intervention provided for discharge planning including assistance with follow-up psychiatric care.    Plan for Family Involvement: None Available----refuses to allow [place] to call his sister or grandmother  Other Providers Contact Information and Dates Contacted: No Updates    Total Attending time spent 35 minutes (floor time) with more than 50 percent of time in direct patient contact, coordinating care and counseling.    Signed by: Nancy Fetter, MD  04/04/2016  1:59 PM

## 2016-04-04 NOTE — Plan of Care (Signed)
Problem: Loss of functioning (Thought Disorder, Mood Disturbance and/or Severe Anxiety) AS EVIDENCED BY...  Goal: Attends a minimum number of therapies daily  Outcome: Progressing  Daily Group Attendance Note:      Orientation and Goals Group:Attended Yes      Group Therapy:Attended Yes      Creative Therapy:Attended No      Specialty Group:Attended No      Recreation Therapy:Attended N/A      Evening Group:Attended No      Wrap Group:Attended No

## 2016-04-05 NOTE — Plan of Care (Signed)
Problem: Loss of functioning (Thought Disorder, Mood Disturbance and/or Severe Anxiety) AS EVIDENCED BY...  Goal: Attends a minimum number of therapies daily  Outcome: Not Progressing  Mental Health Therapy Progress Note  Patient has not attended any groups thus far today.   Will continue to encourage group attendance and participation in order to increase positive coping skills and express feelings regarding current situation.

## 2016-04-05 NOTE — Progress Notes (Signed)
Pt appeared calm and cooperative, remained in bed through the evening shift. No report auditory hallucinations during the evening shift and showed less psychotic symptoms. Pt took medications as prescribed and was to sleep well at night. Safety was maintained.
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
## 2016-04-05 NOTE — Plan of Care (Signed)
Problem: Loss of functioning (Thought Disorder, Mood Disturbance and/or Severe Anxiety) AS EVIDENCED BY...  Goal: Attends a minimum number of therapies daily  Outcome: Progressing  Daily Group Attendance Note:      Orientation and Goals Group:Attended No      Group Therapy:Attended No      Creative Therapy:Attended No      Specialty Group:Attended Yes      Recreation Therapy:Attended No      Evening Group:Attended No      Wrap Group:Attended No

## 2016-04-05 NOTE — Plan of Care (Signed)
Problem: Severe Anxiety  Goal: Verbalizes reduced anxiety  Outcome: Progressing   04/05/16 0932   Goal/Interventions addressed this shift   Verbalizes reduced anxiety  Assist to identify signs/symptoms of anxiety;Educate about stress reduction techniques and healthy coping responses;Facilitate expression of feelings, fears, concerns, anxiety       [name] denies SI/HI but continues to hear voices of "people telling me somebody is going to kill me."  Haldol 5mg  given @ 0854.  He contracts for safety and says the voices are not commanding him to hurt himself.  Patient denies anxiety, has a flat affect, and is isolative to self.  He has been resting in bed.  [name] reports that the goal for today 04/05/2016 is to "get better."  Safety maintained, will continue to monitor and assess.

## 2016-04-06 NOTE — Plan of Care (Signed)
Problem: Thought Disorder  Goal: Conducts goal directed, coherent conversation  Outcome: Not Progressing  [name] presents disorganized. Flat affect. Denies SI/HI/AVH. Perseverative, would only discuss having "metal plates" in his head, and how the metal plates are making him feel "not right." Intermittently visible. Compliant with medications. Will continue to monitor.
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
## 2016-04-06 NOTE — Plan of Care (Signed)
Problem: Thought Disorder  Goal: Conducts goal directed, coherent conversation  Outcome: Progressing  Patient denied SI/HI this evening. He was endorsing AH saying they were telling "wanting to kill him". Patient was isolative to room and self during the shift. He slept during some of the shift. He took all bedtime medications. Will continue to monitor and be available.

## 2016-04-06 NOTE — Progress Notes (Signed)
Patient slept through the night. Safety was maintained and no distressed noted. Will continue to monitor and be available.

## 2016-04-06 NOTE — Plan of Care (Signed)
Problem: Thought Disorder  Goal: Conducts goal directed, coherent conversation  Outcome: Not Progressing   04/06/16 2249   Goal/Interventions addressed this shift   Conducts goal directed, coherent conversation  Assess patient's conversation every shift   Patient has slept the entire evening shift. When asked questions patient stares at writer and does not say anything. He took his evening medication. Will continue to monitor.

## 2016-04-06 NOTE — Plan of Care (Signed)
Problem: Loss of functioning (Thought Disorder, Mood Disturbance and/or Severe Anxiety) AS EVIDENCED BY...  Goal: Attends a minimum number of therapies daily  Daily Group Attendance Note:      Orientation and Goals Group:Attended No      Group Therapy:Attended No      Creative Therapy:Attended No      Specialty Group:Attended Yes      Recreation Therapy:Attended N/A      Evening Group:Attended N/A      Wrap Group:Attended N/A

## 2016-04-07 DIAGNOSIS — F332 Major depressive disorder, recurrent severe without psychotic features: Secondary | ICD-10-CM

## 2016-04-07 DIAGNOSIS — F323 Major depressive disorder, single episode, severe with psychotic features: Secondary | ICD-10-CM | POA: Diagnosis present

## 2016-04-07 DIAGNOSIS — F209 Schizophrenia, unspecified: Secondary | ICD-10-CM

## 2016-04-07 MED ORDER — CLONAZEPAM 0.5 MG PO TABS
0.5000 mg | ORAL_TABLET | Freq: Two times a day (BID) | ORAL | 0 refills | Status: DC
Start: 2016-04-07 — End: 2017-03-12

## 2016-04-07 MED ORDER — RISPERIDONE 4 MG PO TBDP
4.0000 mg | ORAL_TABLET | Freq: Every evening | ORAL | 0 refills | Status: DC
Start: 2016-04-07 — End: 2017-03-12

## 2016-04-07 MED ORDER — NICOTINE 21 MG/24HR TD PT24
1.0000 | MEDICATED_PATCH | Freq: Every day | TRANSDERMAL | 0 refills | Status: DC
Start: 2016-04-08 — End: 2017-03-12

## 2016-04-07 MED ORDER — NICOTINE POLACRILEX 2 MG MT GUM
2.0000 mg | CHEWING_GUM | OROMUCOSAL | 0 refills | Status: DC | PRN
Start: 2016-04-07 — End: 2017-03-12

## 2016-04-07 MED ORDER — DULOXETINE HCL 40 MG PO CPEP
40.0000 mg | ORAL_CAPSULE | Freq: Every day | ORAL | 0 refills | Status: DC
Start: 2016-04-08 — End: 2017-03-12
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
## 2016-04-07 NOTE — Progress Notes (Signed)
Patient slept through the night. Safety maintained.
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
## 2016-04-07 NOTE — Discharge Instr - AVS First Page (Addendum)
Post Hospital Continuing Care Plan, including the After Visit Summary (AVS) and the Psychiatrist's Discharge Summary were faxed [phone] on 08 Apr 2016/8am to [facility]     RECOMMENDED THAT YOU COMPLY WITH THE FOLLOWING PLAN:   walk into [facility]) Monday to Friday from 9 AM to 5 PM for an assessment  [address]  Lower Level  (elevator button [address] [place])  [place], [place] [address]  Entry and Referral [phone]  Main Number [phone]  Please bring ID, proof of insurance, proof of income, proof of residence  The earlier in the day that you go, the better, in order for administrative and clinical intakes to be completed.         [facility]  By phone: [facility]  Are you, a friend or family member in a crisis? Are you seeking mental health or addiction treatment with one of our programs? All new admissions for our Behavioral Health Partial Hospitalization Program, CATS Addiction Programs, and urgent referrals are prescreened through our call center. Our counselors are standing by 24/7 to help.     Call us at [phone].  Assessment services  [facility] offers scheduled assessments for our [facility] Partial Hospitalization Program and [facility] (addiction treatment) Intensive Outpatient Program scheduled through the [facility] Call Center: [phone]. These assessments are scheduled 8:30 a.m. - 6 p.m. Monday through Friday.   [facility])  NEW HOURS as of Thursday, December 13, 2014:  Monday through Saturday, 10 a.m. to 6 p.m. (see detailed information below)      [facility])  [address]  [address]  [place], [place] [address]  (Map and directions)  [phone]  About [facility] Psychiatric Assessment Center [facility])  [facility] provides a unique and valuable resource to the [place] and [place], metro area by  offering urgent psychiatric walk-in evaluations.  At [facility], an adult (age 66 and above) may walk in Monday through Saturday from 10 p.m. to 6 p.m.* (see detailed information below) and receive a psychiatric assessment by one of our highly experienced staff. Upon assessment, a collaborative plan of care tailored to the individual is recommended. The plan may include a medication prescription with a follow up appointment, and/or a referral to one of our mental health or addiction treatment programs.   Walk-in: [facility] office  The [facility] office is open Monday through Saturday from 10 a.m. to 6 p.m. (closed Thanksgiving Day, Christmas Day and New Year's Day). Please note, we are closed the first Wednesday of every month from 3 p.m. to 6 p.m. (last registration at 2 p.m.).  [facility] office is for walk-in assistance; no appointment is needed. Individuals should arrive prior to 4:30 p.m. to be registered in time. Anyone planning on arriving after 4 p.m. is recommended to call ahead of time to determine wait times and availability for that day. We make every effort to see all patients that walk into [facility], however, there are limits to our capacity and the number of patients we can see which is assessed daily. Wait times can vary based upon demand and severity of conditions. Please bring your ID and insurance card. Payment is due at the time of service.     NOTE:   [facility] Providers do not prescribe controlled substances such as stimulants (e.g. Adderall and Ritalin), benzodiazepines (e.g. Xanax and Ativan), and opiates (e.g. Percocet, OxyContin) in the walk-in  clinic.   We are not a participating provider with [employer]. We accept out-of-network benefits with [employer]. [employer] insurance requires a prior authorization to be seen at our facility. We recommend you check your benefits coverage with your insurance company and encourage you to make childcare arrangements prior to visiting our facility.   We are closed the first  Wednesday of every month from 3 p.m. to 6 p.m. (last registration at 2 p.m         FOR EMERGENCY MENTAL HEALTH, CONTACT: [facility] 24/7 [phone]; call 911; or go to the closest emergency room   National Suicide Prevention Lifeline :  ([phone]    The following were reviewed with patient/family by _______________________RN.    1. Reason for IP admission  2. Major procedures and tests, including summary of results  3. Diagnosis at discharge  4. Current medication list  5. Were studies pending at discharge?           No      Yes                                                                                                                                                 If yes, you may call for results at: _______________________(unit phone #)    6. Patient instructions;  Adult Wellness, Recovery, and Safety Plan  7. Emergency contact information  8. Plan for follow up care   9. Name of provider(s), appointment(s), and location(s) of follow up care.                                              Advance Care Plan  10. Patient had a medical and mental health Advance Directive at admission.                                                                            No      Yes    11. If patient did not have a medical and mental health Advance Directive at admission:  information about completing Advance Directives or designating a surrogate decision maker, and a form, was provided.  After receiving information- Did patient create a medical and mental health Advance Directive or appoint a surrogate decision maker?  No      Yes  If No:   12. If no, what is the reason?   ( Check reason)                 _____  Prefer to wait until I feel better        _____  Prefer to discuss with family or significant other        _____  Prefer to discuss with continuing care provider        _____  Do not believe  I will need a mental health Advance Directive        _____  Other_____________________________________________
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
## 2016-04-08 NOTE — Progress Notes (Signed)
Pt  In bed with eyes closed. Safety maintained.

## 2016-04-08 NOTE — Progress Notes (Signed)
Patient had been visible on the unit.denies all psych symptoms . Cooperative with his medications

## 2016-04-08 NOTE — Progress Notes (Signed)
[facility] discharged per MD orders. Denies SI/HI/AVH. Pt verbalized understanding of discharge orders and instructions. AVS signed and placed in chart. Safety plan completed. Belongings/prescriptions given to patient. Follow-up education given.

## 2016-04-08 NOTE — Progress Notes (Signed)
[name] presents isolative to room this morning. Denies all psych symptoms. Compliant with medications. Discharge pending this afternoon. Will continue to monitor.

## 2017-02-26 ENCOUNTER — Encounter (HOSPITAL_BASED_OUTPATIENT_CLINIC_OR_DEPARTMENT_OTHER): Payer: Self-pay

## 2017-02-26 ENCOUNTER — Emergency Department: Payer: Medicaid Other

## 2017-02-26 ENCOUNTER — Inpatient Hospital Stay
Admission: EM | Admit: 2017-02-26 | Discharge: 2017-03-12 | DRG: 750 | Disposition: A | Discharge status: Home / Self Care | Payer: Medicaid Other | Attending: Psychiatry | Admitting: Psychiatry

## 2017-02-26 DIAGNOSIS — F1220 Cannabis dependence, uncomplicated: Secondary | ICD-10-CM | POA: Diagnosis present

## 2017-02-26 DIAGNOSIS — F17200 Nicotine dependence, unspecified, uncomplicated: Secondary | ICD-10-CM | POA: Diagnosis present

## 2017-02-26 DIAGNOSIS — F1920 Other psychoactive substance dependence, uncomplicated: Secondary | ICD-10-CM | POA: Diagnosis present

## 2017-02-26 DIAGNOSIS — R45851 Suicidal ideations: Secondary | ICD-10-CM | POA: Diagnosis present

## 2017-02-26 DIAGNOSIS — F1210 Cannabis abuse, uncomplicated: Secondary | ICD-10-CM | POA: Diagnosis present

## 2017-02-26 DIAGNOSIS — R9401 Abnormal electroencephalogram [EEG]: Secondary | ICD-10-CM | POA: Diagnosis present

## 2017-02-26 DIAGNOSIS — F251 Schizoaffective disorder, depressive type: Principal | ICD-10-CM | POA: Diagnosis present

## 2017-02-26 DIAGNOSIS — Z9114 Patient's other noncompliance with medication regimen: Secondary | ICD-10-CM

## 2017-02-26 DIAGNOSIS — F6089 Other specific personality disorders: Secondary | ICD-10-CM | POA: Diagnosis present

## 2017-02-26 DIAGNOSIS — Z720 Tobacco use: Secondary | ICD-10-CM

## 2017-02-26 DIAGNOSIS — F29 Unspecified psychosis not due to a substance or known physiological condition: Secondary | ICD-10-CM

## 2017-02-26 LAB — CBC AND DIFFERENTIAL
Absolute NRBC: 0 10*3/uL
Basophils Absolute Automated: 0.03 10*3/uL (ref 0.00–0.20)
Basophils Automated: 0.5 %
Eosinophils Absolute Automated: 0.02 10*3/uL (ref 0.00–0.70)
Eosinophils Automated: 0.4 %
Hematocrit: 44.2 % (ref 42.0–52.0)
Hgb: 14.5 g/dL (ref 13.0–17.0)
Immature Granulocytes Absolute: 0.02 10*3/uL
Immature Granulocytes: 0.4 %
Lymphocytes Absolute Automated: 2.03 10*3/uL (ref 0.50–4.40)
Lymphocytes Automated: 35.9 %
MCH: 28.9 pg (ref 28.0–32.0)
MCHC: 32.8 g/dL (ref 32.0–36.0)
MCV: 88 fL (ref 80.0–100.0)
MPV: 9.5 fL (ref 9.4–12.3)
Monocytes Absolute Automated: 0.4 10*3/uL (ref 0.00–1.20)
Monocytes: 7.1 %
Neutrophils Absolute: 3.16 10*3/uL (ref 1.80–8.10)
Neutrophils: 55.7 %
Nucleated RBC: 0 /100 WBC (ref 0.0–1.0)
Platelets: 189 10*3/uL (ref 140–400)
RBC: 5.02 10*6/uL (ref 4.70–6.00)
RDW: 13 % (ref 12–15)
WBC: 5.66 10*3/uL (ref 3.50–10.80)

## 2017-02-26 LAB — ETHANOL: Alcohol: NOT DETECTED mg/dL

## 2017-02-26 LAB — COMPREHENSIVE METABOLIC PANEL
ALT: 25 U/L (ref 0–55)
AST (SGOT): 27 U/L (ref 5–34)
Albumin/Globulin Ratio: 1.3 (ref 0.9–2.2)
Albumin: 4.3 g/dL (ref 3.5–5.0)
Alkaline Phosphatase: 82 U/L (ref 38–106)
BUN: 7 mg/dL — ABNORMAL LOW (ref 9.0–28.0)
Bilirubin, Total: 0.6 mg/dL (ref 0.2–1.2)
CO2: 27 mEq/L (ref 22–29)
Calcium: 9.6 mg/dL (ref 8.5–10.5)
Chloride: 103 mEq/L (ref 100–111)
Creatinine: 1.1 mg/dL (ref 0.7–1.3)
Globulin: 3.3 g/dL (ref 2.0–3.6)
Glucose: 92 mg/dL (ref 70–100)
Potassium: 4.1 mEq/L (ref 3.5–5.1)
Protein, Total: 7.6 g/dL (ref 6.0–8.3)
Sodium: 138 mEq/L (ref 136–145)

## 2017-02-26 LAB — RAPID DRUG SCREEN, URINE
Barbiturate Screen, UR: NEGATIVE
Benzodiazepine Screen, UR: NEGATIVE
Cannabinoid Screen, UR: NEGATIVE
Cocaine, UR: NEGATIVE
Opiate Screen, UR: NEGATIVE
PCP Screen, UR: NEGATIVE
Urine Amphetamine Screen: NEGATIVE

## 2017-02-26 LAB — SALICYLATE LEVEL: Salicylate Level: <5 mg/dL — ABNORMAL LOW (ref 15.0–30.0)

## 2017-02-26 LAB — GFR: EGFR: >60

## 2017-02-26 LAB — ACETAMINOPHEN LEVEL: Acetaminophen Level: <7 ug/mL — ABNORMAL LOW (ref 10–30)

## 2017-02-26 MED ORDER — DIPHENHYDRAMINE HCL 25 MG PO CAPS
50.0000 mg | ORAL_CAPSULE | Freq: Four times a day (QID) | ORAL | Status: DC | PRN
Start: 2017-02-26 — End: 2017-03-12

## 2017-02-26 MED ORDER — HALOPERIDOL 5 MG PO TABS
5.0000 mg | ORAL_TABLET | Freq: Four times a day (QID) | ORAL | Status: DC | PRN
Start: 2017-02-26 — End: 2017-03-12

## 2017-02-26 MED ORDER — LORAZEPAM 2 MG/ML IJ SOLN
1.0000 mg | Freq: Once | INTRAMUSCULAR | Status: DC
Start: 2017-02-26 — End: 2017-02-26

## 2017-02-26 MED ORDER — HALOPERIDOL LACTATE 5 MG/ML IJ SOLN
5.0000 mg | Freq: Four times a day (QID) | INTRAMUSCULAR | Status: DC | PRN
Start: 2017-02-26 — End: 2017-03-12

## 2017-02-26 MED ORDER — DIPHENHYDRAMINE HCL 50 MG/ML IJ SOLN
50.0000 mg | Freq: Four times a day (QID) | INTRAMUSCULAR | Status: DC | PRN
Start: 2017-02-26 — End: 2017-03-12

## 2017-02-26 MED ORDER — SODIUM CHLORIDE 0.9 % IV BOLUS
1000.0000 mL | Freq: Once | INTRAVENOUS | Status: AC
Start: 2017-02-26 — End: 2017-02-26
  Administered 2017-02-26: 1000 mL via INTRAVENOUS

## 2017-02-26 MED ORDER — HALOPERIDOL LACTATE 5 MG/ML IJ SOLN
5.0000 mg | Freq: Once | INTRAMUSCULAR | Status: AC
Start: 2017-02-26 — End: 2017-02-26
  Administered 2017-02-26: 5 mg via INTRAVENOUS
  Filled 2017-02-26: qty 1

## 2017-02-26 MED ORDER — ZOLPIDEM TARTRATE 5 MG PO TABS
10.0000 mg | ORAL_TABLET | Freq: Every evening | ORAL | Status: DC | PRN
Start: 2017-02-26 — End: 2017-03-01

## 2017-02-26 MED ORDER — HALOPERIDOL 5 MG PO TABS
5.0000 mg | ORAL_TABLET | Freq: Once | ORAL | Status: AC
Start: 2017-02-26 — End: 2017-02-26
  Administered 2017-02-26: 23:00:00 5 mg via ORAL
  Filled 2017-02-26: qty 1

## 2017-02-26 MED ORDER — LORAZEPAM 1 MG PO TABS
2.0000 mg | ORAL_TABLET | Freq: Four times a day (QID) | ORAL | Status: DC | PRN
Start: 2017-02-26 — End: 2017-03-12
  Administered 2017-03-06: 21:00:00 2 mg via ORAL
  Filled 2017-02-26: qty 2

## 2017-02-26 MED ORDER — LORAZEPAM 2 MG/ML IJ SOLN
2.0000 mg | Freq: Four times a day (QID) | INTRAMUSCULAR | Status: DC | PRN
Start: 2017-02-26 — End: 2017-03-12
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
## 2017-02-26 NOTE — ED Notes (Signed)
Bed: S 8  Expected date:   Expected time:   Means of arrival: [id number] [place]  Comments:

## 2017-02-26 NOTE — ED Notes (Signed)
19:48    Writer called [employer] at [phone] and started prior-authorization for the presenting patient. Writer was directed to an automated voicemail stating that office is currently closed.     Normal business hours are: M-F 8:00a-5:00p.    Writer is unable to obtain prior-authorization. Writer will e-mail and notify UR Iantha Fallen of incomplete prior-authorization.

## 2017-02-26 NOTE — Plan of Care (Addendum)
New admission nursing note:    S: 26 YO voluntary pt with prior psychosis, presented to ED with gradual onset of command auditory hallucination telling him to hurt himself and others after he smoked K2 (02/26/17) to manage voices, and then he developed brief chest pain.    B: Hx of psychosis, Hx of K2 use (he uses monthly), denied use of ETOH/illicit drugs except for K2, last psychiatric hospitalization here 12/19-12/27/17, stopped taking medications > 30yr.    A:Pt is poor historian, calm and cooperative with admission, flat affect, delayed speech with thought blocking, +command AH to hurt himself, but denied VH,  denied CP, EKG in ED WNL, VSS.    R:Given one time dose Haldol 5mg  prior to sleep, monitor pt on Q51min checks for safety.

## 2017-02-26 NOTE — ED Provider Notes (Signed)
Physician/Midlevel provider first contact with patient: 02/26/17 1544         History     Chief Complaint   Patient presents with   . Suicidal thoughts     26 y.o. Male with prior psychosis unclear if  drug induced psychosis  now presents with gradual onset of hallucination telling him to hurt himself and others.   Patient states he smoked K2 to calm the voices developed brief chest pain.  Patient currently with command hallucinations.  Denies any nausea, vomiting, shortness of breath,o prior cardiac history, family history of early coronary artery disease or using any cocaine.                 History reviewed. No pertinent past medical history.    History reviewed. No pertinent surgical history.    History reviewed. No pertinent family history.    Social  Social History   Substance Use Topics   . Smoking status: Never Smoker   . Smokeless tobacco: Never Used   . Alcohol use No       .     No Known Allergies    Home Medications     Med List Status:  In Progress Set By: Burt Knack, RN at 02/26/2017  3:53 PM                clonazePAM (KLONOPIN) 0.5 MG tablet     Take 1 tablet (0.5 mg total) by mouth 2 (two) times daily.     DULoxetine 40 MG Cap DR Particles     Take 40 mg by mouth daily.     ibuprofen (ADVIL,MOTRIN) 600 MG tablet     Take 1 tablet (600 mg total) by mouth every 6 (six) hours as needed for Pain or Fever.     nicotine (NICODERM CQ) 21 MG/24HR     Place 1 patch onto the skin daily.     nicotine polacrilex (NICORETTE) 2 MG gum     Take 1 each (2 mg total) by mouth every hour as needed for Smoking cessation.     risperiDONE (RISPERDAL-M) 4 MG disintegrating tablet     Take 1 tablet (4 mg total) by mouth nightly.           Review of Systems   Constitutional: Negative for chills and fever.   HENT: Negative for congestion and rhinorrhea.    Eyes: Negative for pain and discharge.   Respiratory: Negative for cough, chest tightness and shortness of breath.    Cardiovascular: Positive for chest pain.  Negative for palpitations and leg swelling.   Gastrointestinal: Negative for abdominal pain, diarrhea, nausea and vomiting.   Genitourinary: Negative for dysuria, frequency and hematuria.   Musculoskeletal: Negative for back pain.   Skin: Negative for pallor.   Neurological: Negative for dizziness, syncope, speech difficulty and light-headedness.   Psychiatric/Behavioral: Positive for agitation, hallucinations and suicidal ideas. Negative for behavioral problems.       Physical Exam    BP: 132/85, Heart Rate: 66, Temp: 97.7 F (36.5 C), Resp Rate: 18, SpO2: 97 %, Weight: 99.8 kg    Physical Exam   Constitutional: He is oriented to person, place, and time. He appears well-developed and well-nourished.   Eyes: No scleral icterus.   Neck: Normal range of motion. Neck supple.   Cardiovascular: Normal rate and regular rhythm.    Pulmonary/Chest: Effort normal and breath sounds normal.   Abdominal: Soft. Bowel sounds are normal.   Musculoskeletal: Normal range of motion.  Neurological: He is alert and oriented to person, place, and time.   Skin: Skin is warm and dry.   Psychiatric: Judgment and thought content normal.   Flat affect  Responding to command hallucination     Nursing note and vitals reviewed.        MDM and ED Course     ED Medication Orders     Start Ordered     Status Ordering Provider    02/26/17 1551 02/26/17 1550  haloperidol lactate (HALDOL) injection 5 mg  Once     Route: Intravenous  Ordered Dose: 5 mg     Kandice Hams    02/26/17 1546 02/26/17 1545  sodium chloride 0.9 % bolus 1,000 mL  Once     Route: Intravenous  Ordered Dose: 1,000 mL     Otilio Connors R    02/26/17 1546 02/26/17 1545    Once     Route: Intravenous  Ordered Dose: 1 mg     Discontinued Ellizabeth Dacruz R             MDM                 Procedures    Clinical Impression & Disposition     Clinical Impression  Final diagnoses:   None        ED Disposition     None           New Prescriptions    No  medications on file      Monitor  Narrow regular 60's      EKG interpreted by me  NSR@60 's early repo nl interval nl axis nl stt  Compared with prior no change  3:53 PM  Spoke with psych liaison will see pt        4:37 PM  Pt medically cleared  currently under ECO  Will admit when bed is available       7:28 PM  Pt accepted by Dr Emeline Gins here     Joice Lofts, MD  02/27/17 [phone]

## 2017-02-26 NOTE — Progress Notes (Signed)
15:83- Liaisons received a call from Dr Verne Grain requesting a psych eval. MHT, Revonda Standard was informed Pt spoke K2 and was accompanied by an Technical sales engineer. Revonda Standard inquired if Pt was an ECO. Dr Verne Grain stated Pt was not an ECO.     16:00- Liaison met with Pt to complete psych eval. Pt endorsed SI with AH stating the voices were telling me to kill myself. Pt stated he had just smoked the K2 an hour prior. Pt was cooperative, but did not seem to have capacity to complete assessment at this time. Pt stated did state that he felt unsafe and would be interested in coming to inpatient voluntarily. As liaison was leaving the room. Community education officer stated Pt came under and ECO.     16:15- Liaison contacted [facility] to inquire about the ECO. [facility] stated it had not been called in.     16:16- Liaison spoke with [employer] and inquired if he had contacted [facility]. Officer stated he had not called it in yet because Dr Verne Grain told him to wait to be assessed by the psych clinician. Liaison request officer Clipp call in the ECO to [facility] to have him assessed by the [facility]. Officer Clipp stated he would contact [facility] now.

## 2017-02-26 NOTE — ED Notes (Signed)
Voluntary CSB referral      CSB: [facility]     CSB contact & phone number: [phone]    Time CSB contacted Central Access: 6:15pm    Where is the pt now: [facility] ER     Is the pt on an ECO: Yes    What time did the ECO begin: 3:40PM     Presenting Problem & Associated Symptoms:      Patient smoked K-2 this afternoon and is experiencing visual and audio hallucinations hearing a loud grown man's voice telling him to kill himself. Sees people who are not there. The auditory voices are telling him to kill himself only. Denies command voices that are homicide in nature. Patient reports no alcohol use and smokes K-2 once a month Does not have family support but has friends. Patient is homeless. Patient reports no psychiatric history and no DETOX. Denies any medical issues. Is willing to go back to a shelter.    Unable to care for self: No    Risk for harm to self: Yes intermittent with reality testing and command voices telling him to kill himself     Does the pt have injuries that are self-inflicted: No    If yes, did they or will they require medical tx in the ER today: No    Risk for harm to others: No    Does pt have a hx of aggression/violence: No    If yes, when was the last known incident and to whom was it directed towards: no     Does the pt have any current legal issues (upcoming court dates, on probation): No    Previous/Current Psych Dx: substance induced psychosis       Is there any Substance Abuse History: Yes K-2 use today and is usually one time a day     When was the last known use: K-2 today     Are there any medical issues: No    Current Medications: NOT ON MEDICATIONS     Do you know if the pt has been medicated in the ED: No    Pt's housing status: homeless, Willl go back to the shelter                                                     Can the pt return home: Yes, will go to a shelter     Does the pt have insurance: Yes, [place] medicaid     Name of insurance (if patient has Medical sales representative or  Medicare, please verify if it is a managed care plan, ie: Programmer, applications or [employer] Sin) : [employer] telephone number:  N/A     NOTE:  Insurance authorization must be obtained by central access staff, prior to admission, please document the authorization once it is obtained    Is there a petitioner if pt changes to TDO status: [employer] number for the petitioner: [phone]     Is the pt currently an open case with the CSB: No    Treatment providers (CSB or private) name: No current CSB services     Has the pt been hospitalized before: Yes one time 12/19-12/27/17  Patient was previously hospitalized for 8 days.     He arrived at the ER  with NO Clothes, and no shoes, and no coat.  He cannot explain what happened to his clothing.  He came with no wallet. Later he was able to tell us that he was in a store, the voices told him his clothes were not right and to take them off, and he did.    Date and place of last hospitalization: [facility]     All of the clinical information was obtained by patient  from the CSB via patient    Pt accepted by Dr. Emeline Gins at [facility]     All clinical information was given to [facility]  on the psych unit at time 7 pm     CSB Representative Notified regarding admission decision Yes, Denver Faster    Name of CSB Clinican: [place] Faster     Additional info (if pt declined, list MD and reason):      ED physician notified of admission decision Yes, Dr. Verne Grain     Name of ED physician notified: Dr. Verne Grain

## 2017-02-27 LAB — ECG 12-LEAD
Atrial Rate: 61 {beats}/min
P Axis: 43 degrees
P-R Interval: 162 ms
Q-T Interval: 398 ms
QRS Duration: 90 ms
QTC Calculation (Bezet): 400 ms
R Axis: 51 degrees
T Axis: 1 degrees
Ventricular Rate: 61 {beats}/min

## 2017-02-27 LAB — HEMOLYSIS INDEX: Hemolysis Index: 9 (ref 0–18)

## 2017-02-27 LAB — LIPID PANEL
Cholesterol / HDL Ratio: 4.9
Cholesterol: 173 mg/dL (ref 0–199)
HDL: 35 mg/dL — ABNORMAL LOW (ref 40–9999)
LDL Calculated: 124 mg/dL — ABNORMAL HIGH (ref 0–99)
Triglycerides: 71 mg/dL (ref 34–149)
VLDL Calculated: 14 mg/dL (ref 10–40)

## 2017-02-27 MED ORDER — QUETIAPINE FUMARATE 25 MG PO TABS
25.0000 mg | ORAL_TABLET | Freq: Every evening | ORAL | Status: DC
Start: 2017-02-27 — End: 2017-03-01
  Administered 2017-02-28: 22:00:00 25 mg via ORAL
  Filled 2017-02-27 (×2): qty 1

## 2017-02-27 MED ORDER — OLANZAPINE 5 MG PO TBDP
5.0000 mg | ORAL_TABLET | Freq: Two times a day (BID) | ORAL | Status: DC
Start: 2017-02-27 — End: 2017-03-01
  Administered 2017-02-27 – 2017-02-28 (×3): 5 mg via ORAL
  Filled 2017-02-27 (×6): qty 1

## 2017-02-27 MED ORDER — CLONAZEPAM 0.5 MG PO TABS
0.5000 mg | ORAL_TABLET | Freq: Two times a day (BID) | ORAL | Status: DC
Start: 2017-02-27 — End: 2017-03-01
  Administered 2017-02-27 – 2017-02-28 (×3): 0.5 mg via ORAL
  Filled 2017-02-27 (×4): qty 1

## 2017-02-27 NOTE — Plan of Care (Signed)
Problem: At Risk for Harm to Self: AS EVIDENCED BY...  Goal: Reduction of self-harm thoughts and no attempts  Outcome: Progressing  Mostly isolative. Slept most of the shift. Reports "feeling better." Thought blocking present. Denies SI/HI/AVH.

## 2017-02-27 NOTE — Plan of Care (Signed)
Problem: At Risk for Harm to Self: AS EVIDENCED BY...  Goal: Attends a minimum number of therapies daily  Outcome: Not Progressing  Mental Health Therapy Progress Note    Patient has not attended any groups thus far today.   Will continue to encourage group attendance and participation in order to increase positive coping skills and express feelings regarding current situation.

## 2017-02-27 NOTE — Plan of Care (Signed)
Problem: At Risk for Harm to Self: AS EVIDENCED BY...  Goal: Attends a minimum number of therapies daily  Daily Group Attendance Note:      OrientationandGoalsGroup:AttendedNo      Creative Therapy/Art Therapy:Attended No      Group Therapy :Attended No      Specialty Group:Attended No      Evening Group:Attended No

## 2017-02-27 NOTE — H&P (Signed)
PSYCHIATRY ADMISSION NOTE      Patient Name: [name]     MRN:  [id number]      DOB: [birth year]   -   Age: 26 y.o.      Gender: male      Admission Date/Time: 02/26/2017  3:43 PM  Current Date/Time:  02/27/2017 / 3:31 PM  Admitting Physician: Willeen Cass, MD   Attending Physician: Willeen Cass, MD    I. History   Informants:   Interview with the patient, review of available medical records, discussion with the treatment team on the unit    LEGAL STATUS:  Voluntary     CHIEF COMPLAINT or REASON FOR ADMISSION  Chief Complaint   Patient presents with   . Suicidal thoughts            HISTORY OF PRESENT ILLNESS:  (Symptoms and qualifiers:1-3 for brief, at least 4 for extended)    [name] is a 26 y.o. single, homeless, unemployed African-American Male with prior psychosis unclear if  drug induced psychosis  now presents with gradual onset of hallucination telling him to hurt himself and others.   Patient states he smoked K2 to calm the voices developed brief chest pain.  Patient currently with command hallucinations.  Denies any nausea, vomiting, shortness of breath,o prior cardiac history, family history of early coronary artery disease.       PAST PSYCHIATRIC HISTORY:     Prior psychiatric history before this episode, prior diagnosis: Schizophrenia.  Polysubstance dependence   Prior psychiatric hospitalizations: Multiple hospitalizations   Prior suicide attempts, self-injurious behaviors:   Prior treatments:   Medications:   Psychotherapies:   Current psychiatrist/therapist:    SUBSTANCE USE HISTORY:   Alcohol:   Illicit drugs: K2 and other substances   Prescription drugs: Unknown   Tobacco:   Detox history: YES    Rehab history: NO    MEDICAL HISTORY:  Medical/Surgical history:  History reviewed. No pertinent past medical history.  History reviewed. No pertinent surgical history.    Allergies: No Known Allergies    Home Medications:  Prior to Admission medications    Medication Sig  Start Date End Date Taking? Authorizing Provider   clonazePAM (KLONOPIN) 0.5 MG tablet Take 1 tablet (0.5 mg total) by mouth 2 (two) times daily. 04/07/16   Nancy Fetter, MD   DULoxetine 40 MG Cap DR Particles Take 40 mg by mouth daily. 04/08/16   Nancy Fetter, MD   ibuprofen (ADVIL,MOTRIN) 600 MG tablet Take 1 tablet (600 mg total) by mouth every 6 (six) hours as needed for Pain or Fever. 06/11/14   Erick Blinks, PA   nicotine (NICODERM CQ) 21 MG/24HR Place 1 patch onto the skin daily. 04/08/16   Nancy Fetter, MD   nicotine polacrilex (NICORETTE) 2 MG gum Take 1 each (2 mg total) by mouth every hour as needed for Smoking cessation. 04/07/16   Nancy Fetter, MD   risperiDONE (RISPERDAL-M) 4 MG disintegrating tablet Take 1 tablet (4 mg total) by mouth nightly. 04/07/16   Nancy Fetter, MD       Review of Systems  (Extended 2-9, Complete 10 or more)  Uncooperative for 14 point medical review of systems    FAMILY HISTORY:   Unknown    PSYCHOSOCIAL HISTORY:    Born/raised:   History of child abuse: Unknown.  Unknown   Education:   Work history: Unknown   Marriage/relationship: Single   Living situation:  Homeless   Legal problems: Unknown    Solicitor Information (family, surrogate, DPOA, caretaker, healthcare providers):  Extended Emergency Contact Information  Primary Emergency Contact: No,Contact   [place]  Mobile Phone: [phone]  Relation: Unknown      II. Examination     Physical Examination:  Vitals:   Vitals:    02/26/17 1700 02/26/17 1945 02/26/17 2105 02/27/17 0812   BP: 140/85 144/82 135/85 112/73   Pulse: 61 62 66 70   Resp: 19 17 18     Temp:   97.8 F (36.6 C) 98.6 F (37 C)   TempSrc:    Oral   SpO2: 97% 97%  97%   Weight:   99.8 kg (220 lb)    Height:   1.676 m (5\' 6" )        General Condition:   Well nourished, well developed. No acute distress.   Skin:   Warm and dry. No rash or lesions.  HEENT:   NC/AT.  Sclerae non-icteric.  PERRLA.  EOM intact.   Neck:    Trachea midline, neck supple.  Thyroid gland & Lymph nodes non-palpable.  Chest:    Lungs :  CTAB.  No rales or wheezes.    Heart:   RRR.  S1, S2 audible. No murmurs, gallops, rubs.    Abdomen:   Non-distended.  Soft and non-tender.  No palpable masses.  Bowel sounds (+).  Genitals:  Deferred.  Musculoskeletal & Extremities:   No deformities or edema. ROM normal in all extremities.  Muscle tone: normal. Muscle strength 5/5 bilaterally.  Neuro:   A&O x3.  CN II-XII grossly intact. No focal deficits.     Mental Status Exam:    Psychiatric Specialty Examination    1-5 bullets:  problem focused   At least 6 bullets: expanded problem focused    At least 9 bullets:  detailed   All bullets: comprehensive exam   [x]  Vital Signs:      Vitals:    02/27/17 0812   BP: 112/73   Pulse: 70   Resp:    Temp: 98.6 F (37 C)   SpO2: 97%        General Appearance and Manner:        [x] age appropriate    [] bearded    [] casually dressed      [] deviant     [] cooperative   [x] disheveled    [] older than stated age    [] overweight     [] piercings    [x] tattooed    [] thin & gaunt looking    [] well dressed    [] younger than stated age     [] good eye contact    [x] avoidant eye contact    [] hesitant     Musculoskeletal:   [] normal     [] tremor     [] rigidity     [] flaccid       [] akathesia    [] choreaoathetoid movt      [] tics    Gait:   [x] normal gait     [] gait abnormality_______       Mood:   []  fine      [] euthymic     [x] anxious      [x] sad       [x] depressed      [x] angry       [] euphoric      [] irritable     Affect:   []  full range       [x] constricted        [  x]blunted         [] flat          [] bizarre   Speech:   [] Normal pitch       [] normal volume      [] articulation error      [x] delayed      [] increased latency of response       [] loud       [] pressured      [] profane       [] soft       [] perseveration   Thought processes:    [] Normal     []  goal directed     [] logical     []  illogical     [x] flight of ideas       [x] disorganized     [] concrete      []  associations intact      []  abstract reasoning intact  Description of associations:   []  loose     [] circumstantial      [] concrete      [] tangential      []  intact   Thought content:  [] NO delusions      [x] delusions     [] persecutory       [x] referential       [] religious       [] grandiose      [] bizarre        Safety:  []  absent of suicidal or homicidal ideation       [] suicidal ideation      [] suicidal plan      [] suicidal intent      [] passive suicidal ideation      [] homicidal ideation       [] homicidal plan      [] homicidal intent   []  actively trying to hurt self       []  agitation       []  preoccupation with violence   Perception:   [] NO hallucination(s)      [x] hallucination(s)    [x] auditory      [x] visual     [] tactile      [] olfactory      [] gustatory       Orientation:     [] fully oriented               [x] disoriented to:  [] time   []  Person   [] Place    [x] Situation   Memory :  []  grossly intact     []  immediate memory deficit      [x]  recent memory deficit      [x] remote memory deficit    [] MMSE_________    [] MOCA__________     Attention/Concentration:   [] normal       [x]  distractible        [] inattentive   Language:   [] age appropriate        [x]  naming okay       []  repetition    Fund of knowledge:   [] age appropriate       []  adequate       [x] in adequate       []  above average   Judgment & Insight:  []  intact      []  limited      [x]  fair      [x] significantly lacking     []  description_________   Other findings:          Psychiatric / Cognitive Instruments:   None    Imaging / EKG / Labs:   Normal  III. Assessment and Plan (Medical Decision Making)     1. I certify that this patient requires inpatient hospitalization due to His current condition.  Patient continued to be disorganized, psychotic, paranoid, delusional, poorly cooperative    2. Psychiatric Diagnoses:   Axis I:   Schizophrenia, chronic 295.90   Polysubstance dependence   K-2  Dependence   Substance induced psychotic disorder/mood disorder   Axis II:   Deferred.  799.90   Cluster B personality disorder   Axis III:     History reviewed. No pertinent past medical history.  History reviewed. No pertinent surgical history.   Axis IV:   Moderate to severe   Axis V:    GAF on admission:  25   Current GAF:  25    3.  Labs reviewed and compared to prior labs in the system. Past medical records reviewed. Coordination of care was discussed with inpatient team and as available with the outpatient team.    4. Assessment / Impression  [name] is a 26 y.o. single, homeless, unemployed African-American Male with prior psychosis unclear if  drug induced psychosis  now presents with gradual onset of hallucination telling him to hurt himself and others.   Patient states he smoked K2 to calm the voices developed brief chest pain.  Patient currently with command hallucinations.  Patient has a long history of polysubstance dependence, in and out of hospitals.  Noncompliant with the treatment recommendations    5. Suicide Risk Assessment  Patient is isolative, not a suicidal risk at this time on the unit    6. Treatment Plans / Recommendations:   Safety:  I certify that inpatient treatment is needed for his current condition   Biological plan:   Medications:   Work-up:   Consult:   Psychological plan:   Individual therapy:  Psycho-education, insight-oriented, supportive therapy will be given during daily visits.   Group and milieu therapies: daily per unit's schedule.   Social services & discharge plan:   Outpatient drug rehabilitation services   Case management services recommended    Signed by:   Violeta Gelinas, MD PhD, FACP  02/27/17 / 3:31 PM     *This note was generated by the Epic EMR system/ Dragon speech recognition and may contain inherent errors or omissions not intended by the user. Grammatical errors, random word insertions, deletions, pronoun errors and  incomplete sentences are occasional consequences of this technology due to software limitations. Not all errors are caught or corrected. If there are questions or concerns about the content of this note or information contained within the body of this dictation they should be addressed directly with the author for clarification

## 2017-02-27 NOTE — Plan of Care (Signed)
Problem: At Risk for Harm to Self: AS EVIDENCED BY...  Goal: Identifies stressors, protective factors, and coping strategies   02/27/17 2323   Goal/Interventions addressed this shift   Identifies stressors, protective factors and coping strategies Assist patient to identify stressors, protective factors, and develop healthy coping strategies for self-destructive feelings;Assist patient to identify trigger(s) for self-harm behavior;Reinforce patient's use/efforts to use healthy coping strategies during hospitalization   Pt was received lying in bed with eyes closed and appears to be sleeping. Unable to assess for psych symptoms. Nightly Seroquel 25 mg was held because pt was sleeping. Staff will continue to monitor pt for safety.

## 2017-02-27 NOTE — Plan of Care (Signed)
Pt was observed resting in bed with eyes closed, no acute distress noted and maintained safety.

## 2017-02-28 NOTE — Plan of Care (Signed)
Problem: At Risk for Harm to Self: AS EVIDENCED BY...  Goal: Attends a minimum number of therapies daily  Outcome: Not Progressing  Daily Group Attendance Note:      OrientationandGoalsGroup:AttendedNo      Creative Therapy/Art Therapy:Attended No      Group Therapy :Attended No      Specialty Group:Attended No      Evening Group:Attended No

## 2017-02-28 NOTE — Progress Note - Problem Oriented Charting Notewrit (Signed)
Psychiatry Progress Note  (Level 1 = Problem Focused, Level 2 = Expanded Problem Focused, Level 3 = Detailed)    Patient Name: [name]            Current Date/Time:  11/18/20182:25 PM  MRN:  [id number]                            Attending Physician: Willeen Cass, MD  DOB: March 10, 1991                                Gender: male                              I. History   Informants:  Interview with the patient, review of available medical records, discussion with the treatment team on the unit  A. Chief Complaint or Reason for Admission      Psychotic, delusional, paranoid, substance-induced psychosis   B.History of Present Illness: Interval History     (Symptoms and qualifiers:1 for Level 1, 2 for Level 2 and 3+ for Level 3)  [name] is a 26 y.o. single, homeless, unemployed African-American Male with prior psychosis unclear if drug induced psychosis now presents with gradual onset of hallucination telling him to hurt himself and others. Patient states he smoked K2 to calm the voices developed brief chest pain. Patient currently with command hallucinations. Denies any nausea, vomiting, shortness of breath,o prior cardiac history, family history of early coronary artery disease.  Patient was noted to be using K2 psychoactive substances prior to the admission.    C.Medical History  Review of Systems  (Level 1 is none, Level 2 is 1, and Level 3 is 2+)  Poorly cooperative for 14 point medical review of systems    D.Additional Past Psychiatric, Substance Use, Medical, Family and Social History  Psychiatric: No new information available as compared to previous encounter(s)  Substance Use: No new information available as compared to previous encounter(s)  Medical: No new information available as compared to previous encounter(s)  Family: No new information available as compared to previous encounter(s)  Social: No new information available as compared to previous encounter(s)    Medications:    Current Medications    Scheduled     Medication Dose/Rate, Route, Frequency Last Action    clonazePAM (KlonoPIN) tablet 0.5 mg 0.5 mg, PO, BID Given: 11/18 0949    OLANZapine zydis (ZyPREXA ZYDIS) disintegrating tablet 5 mg 5 mg, PO, BID Given: 11/18 0949    QUEtiapine (SEROquel) tablet 25 mg 25 mg, PO, QHS Ordered         PRN     Medication Dose/Rate, Route, Frequency Last Action    diphenhydrAMINE (BENADRYL) capsule 50 mg 50 mg, PO, Q6H PRN Ordered    diphenhydrAMINE (BENADRYL) injection 50 mg 50 mg, IM, Q6H PRN Ordered    haloperidol (HALDOL) tablet 5 mg 5 mg, PO, Q6H PRN Ordered    haloperidol lactate (HALDOL) injection 5 mg 5 mg, IM, Q6H PRN Ordered    LORazepam (ATIVAN) injection 2 mg 2 mg, IM, Q6H PRN Ordered    LORazepam (ATIVAN) tablet 2 mg 2 mg, PO, Q6H PRN Ordered    zolpidem (AMBIEN) tablet 10 mg 10 mg, PO, QHS PRN Ordered  II. Examination   Vital signs reviewed:   Blood pressure 117/74, pulse (!) 54, temperature 97.3 F (36.3 C), temperature source Oral, resp. rate 18, height 1.676 m (5\' 6" ), weight 99.8 kg (220 lb), SpO2 96 %.     Mental Status Exam  (Level 1 is 1-5, Level 2 is 6-8, Level 3 is 9+)  General appearance: Well-developed, clean-shaven African-American male with a large him multiple tattoos all over his body, appeared to be quite psychotic, poorly cooperative  Attitude/Behavior: Poorly cooperative, quiet, isolative  Motor: Normal  Gait: Appeared to be normal  Muscle strength and tone: Appeared to be normal  Speech:  Slow, clear  Mood: Depressed, sad  Affect:   Anxious, restricted  Thought Process:  Patient is not suicidal, not homicidal.  Not in distress  Associations: Noted to have loose associations  Thought Content:  Patient denies hearing voices, denies suicidal or homicidal ideations  Perceptions:  Appeared to have perceptual disturbance  Insight: Impaired  Judgment: Impaired  Cognition:  Cognitive and memory functions appeared to be superficially intact    Psychiatric  / Cognitive Instruments: None    Pertinent Physical Exam: Physical examination is essentially unremarkable from the previous examination    Imaging / EKG / Labs: Labs are essentially unremarkable from the previous examination    III. Assessment and Plan (Medical Decision Making)     1. I certify that this patient continues to require inpatient hospitalization due to His current condition and intoxication with psychoactive substances and also previously diagnosed psychiatric disorder    2. Psychiatric Diagnoses   Axis I    Schizophrenia, chronic 295.90   Substance induced mood disorder, psychotic disorder   Polysubstance dependence   K-2 dependency   Axis II    Deferred 799.90   Cluster B personality disorder   Axis III  History reviewed. No pertinent past medical history.  History reviewed. No pertinent surgical history.   Axis IV   Problems with primary support group   Axis V   Current GAF: 11-20: some danger of hurting self or others possible OR occasionally fails to maintain minimal personal hygiene OR gross impairment in communication.    3.All diagnostic procedures completed since admission were reviewed. Past medical records reviewed. Coordination of care was discussed with inpatient team and as available with the outpatient team.    4. On this admission patient educated about and provided input into their treatment plan.  Patient understands potential risks and benefits of proposed treatment plan.     5.  Assessment / Impression  Patient is a 26 y.o. Black male with a history of polysubstance dependency.  Psychotic disorder was admitted to the hospital following intoxication with K2.    Suicide Risk Assessment   Suicide Risk Assessment performed?   Patient is not a suicidal risk at this time on the unit  Plan / Recommendations:    Biological Plan:  Medications:   Medical Work-up:   Consults:     Psychosocial Plan:  Individual therapy: Psycho-education and psychotherapy of the following modalities  will continue to be provided during daily visits  Group and milieu therapies: daily per unit's schedule.  Continue with Social Work intervention provided for discharge planning including assistance with .    Plan for Family Involvement:   Other Providers Contact Information and Dates Contacted:     Total Attending time spent 35 minutes (floor time) with more than 50 percent of time in direct patient contact, coordinating care and counseling.  Signed by: Violeta Gelinas, MD  02/28/2017  2:25 PM     *This note was generated by the Epic EMR system/ Dragon speech recognition and may contain inherent errors or omissions not intended by the user. Grammatical errors, random word insertions, deletions, pronoun errors and incomplete sentences are occasional consequences of this technology due to software limitations. Not all errors are caught or corrected. If there are questions or concerns about the content of this note or information contained within the body of this dictation they should be addressed directly with the author for clarification

## 2017-02-28 NOTE — Plan of Care (Signed)
Problem: At Risk for Harm to Self: AS EVIDENCED BY...  Goal: Attends a minimum number of therapies daily  Outcome: Not Progressing  Patient has not attended any groups thus far today.   Will continue to encourage group attendance and participation in order to increase positive coping skills and express feelings regarding current situation.

## 2017-02-28 NOTE — Progress Notes (Signed)
Pt is in bed with eyes closed and appears to be sleeping. Breathing and respirations with in normal limits as evidenced by the rise and fall of pt's chest. Continue to monitor for safety.

## 2017-02-28 NOTE — Plan of Care (Signed)
Problem: At Risk for Harm to Self: AS EVIDENCED BY...  Goal: Attends a minimum number of therapies daily  Outcome: Not Progressing   02/28/17 2001   Goal/Interventions addressed this shift   Attends a minimum number of therapies daily  Encourage attendance and reinforce small successes in participation   Assumed care of patient at 07:30.  Patient was up and taking a shower at change of shift.  Patient's mood is "ok"  Pt endorsed AH and stated " they are mumbling"  Pt denied VH, SI, HI, paranoia or delusions.  Patient ate 100% of his Breakfast and Lunch.  Patient refused to go to groups today and was isolate to his room.  Patient agreed to come to staff if feeling unsafe to self or others.  As the day progressed, the patient stated " the voices are better now."  Encouraged patient to continue to take his medication and he verbalized understanding.

## 2017-03-01 MED ORDER — DOCUSATE SODIUM 100 MG PO CAPS
100.0000 mg | ORAL_CAPSULE | Freq: Every day | ORAL | Status: DC | PRN
Start: 2017-03-01 — End: 2017-03-12

## 2017-03-01 MED ORDER — ACETAMINOPHEN 325 MG PO TABS
650.0000 mg | ORAL_TABLET | Freq: Four times a day (QID) | ORAL | Status: DC | PRN
Start: 2017-03-01 — End: 2017-03-12
  Administered 2017-03-10: 650 mg via ORAL
  Filled 2017-03-01: qty 2

## 2017-03-01 MED ORDER — ALUM & MAG HYDROXIDE-SIMETH 200-200-20 MG/5ML PO SUSP
30.0000 mL | ORAL | Status: DC | PRN
Start: 2017-03-01 — End: 2017-03-12

## 2017-03-01 MED ORDER — RISPERIDONE 1 MG PO TABS
1.0000 mg | ORAL_TABLET | Freq: Once | ORAL | Status: AC
Start: 2017-03-01 — End: 2017-03-01
  Administered 2017-03-01: 10:00:00 1 mg via ORAL
  Filled 2017-03-01: qty 1

## 2017-03-01 MED ORDER — TRAZODONE HCL 50 MG PO TABS
50.0000 mg | ORAL_TABLET | Freq: Every evening | ORAL | Status: DC | PRN
Start: 2017-03-01 — End: 2017-03-12
  Administered 2017-03-06: 21:00:00 50 mg via ORAL
  Filled 2017-03-01: qty 1

## 2017-03-01 MED ORDER — RISPERIDONE 1 MG PO TABS
2.0000 mg | ORAL_TABLET | Freq: Two times a day (BID) | ORAL | Status: DC
Start: 2017-03-01 — End: 2017-03-04
  Administered 2017-03-01 – 2017-03-04 (×6): 2 mg via ORAL
  Filled 2017-03-01 (×6): qty 2

## 2017-03-01 NOTE — Progress Note - Problem Oriented Charting Notewrit (Addendum)
Psychiatry Progress Note  (Level 1 = Problem Focused, Level 2 = Expanded Problem Focused, Level 3 = Detailed)    Patient Name: [name]            Current Date/Time:  11/19/20181:07 PM  MRN:  [id number]                            Attending Physician: Erin Sons, MD  DOB: March 22, 1991                                Gender: male                              I. History   Informants:  Interview with the patient, review of available medical records, discussion with the treatment team on the unit message left for Dr Jerre Simon MD  A. Chief Complaint or Reason for Admission      Psychotic, delusional, paranoid, substance-induced psychosis   B.History of Present Illness: Interval History     (Symptoms and qualifiers:1 for Level 1, 2 for Level 2 and 3+ for Level 3)  26 yo M presents in acute psychosis in context of mental illness (SIMD/SIPD vs MDD vs schizoaffective), substance use (K2) and psychosocial stressors (homless, unemployed).  Patient is currently isolative to room and attended 0/5 groups yesterday which he attributes to sleepiness.  States that the voices are getting better and rates his mood as 6/10 but believes medications are helping.  Endorses low energy, otherwise denies SIG E CAPS.  Sates that he continues to hear CAH to hurt self without VH and that he only heard them after smoking K2, never heard voices before (of note, has prior admission for psychosis and SI in setting of substance use).  Denies chest pain.    Patient seen in Dec 2017 for SI and new onset psychosis in setting of recent homelessness.  Denied mania, anxiety or trauma history.  Was discharged on Klonipin 0.5mg  BID, duloxetine 40mg  and risperdal 4mg  qhs.  At last discharge, patient was supposed to follow-[facility] at [facility] but never did.    [facility]:  - escitalopram 20mg  daily - Dr. Orlene Erm   - haloperidol 5mg  BID - Dr. Orlene Erm    C.Medical History  Review of Systems  (Level 1 is none, Level 2 is 1, and Level 3 is  2+)  Poorly cooperative for 14 point medical review of systems    D.Additional Past Psychiatric, Substance Use, Medical, Family and Social History  Psychiatric: No new information available as compared to previous encounter(s)  Substance Use: No new information available as compared to previous encounter(s)  Medical: No new information available as compared to previous encounter(s)  Family: No new information available as compared to previous encounter(s)  Social: No new information available as compared to previous encounter(s)    Medications:   Current Medications    Scheduled     Medication Dose/Rate, Route, Frequency Last Action    risperiDONE (RisperDAL) tablet 2 mg 2 mg, PO, Q12H Ordered         PRN     Medication Dose/Rate, Route, Frequency Last Action    acetaminophen (TYLENOL) tablet 650 mg 650 mg, PO, Q6H PRN Ordered    alum & mag hydroxide-simethicone (MAALOX PLUS) 200-200-20 mg/5 mL suspension 30 mL 30 mL,  PO, Q4H PRN Ordered    diphenhydrAMINE (BENADRYL) capsule 50 mg 50 mg, PO, Q6H PRN Ordered    diphenhydrAMINE (BENADRYL) injection 50 mg 50 mg, IM, Q6H PRN Ordered    docusate sodium (COLACE) capsule 100 mg 100 mg, PO, QD PRN Ordered    haloperidol (HALDOL) tablet 5 mg 5 mg, PO, Q6H PRN Ordered    haloperidol lactate (HALDOL) injection 5 mg 5 mg, IM, Q6H PRN Ordered    LORazepam (ATIVAN) injection 2 mg 2 mg, IM, Q6H PRN Ordered    LORazepam (ATIVAN) tablet 2 mg 2 mg, PO, Q6H PRN Ordered    traZODone (DESYREL) tablet 50 mg 50 mg, PO, QHS PRN Ordered                II. Examination   Vital signs reviewed:   Blood pressure 101/69, pulse (!) 59, temperature 97.9 F (36.6 C), temperature source Oral, resp. rate 16, height 1.676 m (5\' 6" ), weight 99.8 kg (220 lb), SpO2 94 %.     Mental Status Exam  (Level 1 is 1-5, Level 2 is 6-8, Level 3 is 9+)  General appearance: Well-developed, clean-shaven African-American male with a large him multiple tattoos all over his body, laying in bed in no acute  distress  Attitude/Behavior: Minimally cooperative, quiet, isolative  Motor: Normal  Gait: Appeared to be normal  Muscle strength and tone: Appeared to be normal  Speech:  Slow, clear, yes or no answers  Mood: "6/10"  Affect:  Flat  Thought Process:  Patient is not suicidal, not homicidal.  Not in distress  Associations: Impoverished  Thought Content:  Patient denies hearing voices, denies suicidal or homicidal ideations  Perceptions: Does not appear to have perceptual disturbance  Insight: Impaired  Judgment: Impaired  Cognition:  Cognitive and memory functions appeared to be superficially intact    Psychiatric / Cognitive Instruments: None    Pertinent Physical Exam: Physical examination is essentially unremarkable from the previous examination    Imaging / EKG / Labs: Labs are essentially unremarkable from the previous examination    III. Assessment and Plan (Medical Decision Making)     1. I certify that this patient continues to require inpatient hospitalization due to His current condition and intoxication with psychoactive substances and also previously diagnosed psychiatric disorder    2. Psychiatric Diagnoses   Axis I    Schizophrenia, chronic 295.90   Substance induced mood disorder, psychotic disorder   Polysubstance dependence   K-2 dependency   Axis II    Deferred 799.90   Cluster B personality disorder   Axis III  History reviewed. No pertinent past medical history.  History reviewed. No pertinent surgical history.   Axis IV   Problems with primary support group   Axis V   Current GAF: 11-20: some danger of hurting self or others possible OR occasionally fails to maintain minimal personal hygiene OR gross impairment in communication.    3.All diagnostic procedures completed since admission were reviewed. Past medical records reviewed. Coordination of care was discussed with inpatient team and as available with the outpatient team.    4. On this admission patient educated about and provided  input into their treatment plan.  Patient understands potential risks and benefits of proposed treatment plan.     5.  Assessment / Impression  26 yo M presents in acute psychosis in context of mental illness (SIMD/SIPD vs MDD vs schizoaffective), substance use (K2) and psychosocial stressors (homless, unemployed).  Psychosis appears to be  improving with abstinence and antipsychotic treatment; most likely substance-induced.    Suicide Risk Assessment   Suicide Risk Assessment performed?   Patient is not a suicidal risk at this time on the unit  Plan / Recommendations:    Biological Plan:  Medications:  Discontinue Clonazepam (no anxiety hx), Zyprexa and Seroquel; start Risperdal 2mg  BID given improvement at last discharge; no antidepressant indicated at this moment despite prior Rx given lack of mood sxs currently  Medical Work-up: none  Consults: none    Psychosocial Plan:  Individual therapy: Psycho-education and psychotherapy of the following modalities will continue to be provided during daily visits  Group and milieu therapies: daily per unit's schedule.  Continue with Social Work intervention provided for discharge planning including assistance with .    Plan for Family Involvement:   Other Providers Contact Information and Dates Contacted:     Total Attending time spent 35 minutes (floor time) with more than 50 percent of time in direct patient contact, coordinating care and counseling.    Signed by: Buck Mam, MD  03/01/2017  1:07 PM     I have read the note of the resident and agree fully. Pt wishes to cont lexparo. Very poor hx as he says he has never been here before and denies being on haldol. Await call from Dr Jerre Simon.  [facility] La[facility]-Kavita Bartl

## 2017-03-01 NOTE — Discharge Instr - AVS First Page (Addendum)
[facility] Continuing Care Plan, including all 11 elements of the After Visit Summary (AVS) were faxed to [facility], Dr. Omelia Blackwater at [phone] on 03/11/17.    RECOMMENDED THAT YOU COMPLY WITH THE FOLLOWING PLAN:   Follow up appointment with:  Dr. Omelia Blackwater                  Date: 12/3      Time: 2:30 pm  Location:   [id number] [address]  P: [phone]  F: [phone]    FOR EMERGENCY MENTAL HEALTH, CONTACT: ________    National Suicide Prevention Lifeline :  [phone]      The following were reviewed with patient/family by _______________________RN.    1. Reason for IP admission  2. Major procedures and tests, including summary of results  3. Diagnosis at discharge  4. Current medication list  5. Were studies pending at discharge?           No      Yes                                                                                                                                                 If yes, you may call for results at: _______________________(unit phone #)    6. Patient instructions;  Adult Wellness, Recovery, and Safety Plan  7. Emergency contact information  8. Plan for follow up care   9. Name of provider(s), appointment(s), and location(s) of follow up care.                                              Advance Care Plan  10. Patient had a medical and mental health Advance Directive at admission.                                                                            No      Yes    11. If patient did not have a medical and mental health Advance Directive at admission:  information about completing Advance Directives or designating a surrogate decision maker, and a form, was provided.  After receiving information- Did patient create a medical and mental health Advance Directive or appoint a surrogate decision maker?  No      Yes  If  No:   12. If no, what is the reason?   ( Check reason)                 _____  Prefer to wait until I feel better        _____  Prefer to discuss with family or significant other        _____  Prefer to discuss with continuing care provider        _____  Do not believe I will need a mental health Advance Directive        _____  Other_____________________________________________

## 2017-03-01 NOTE — Plan of Care (Signed)
Problem: At Risk for Harm to Self: AS EVIDENCED BY...  Goal: Attends a minimum number of therapies daily  Outcome: Not Progressing  Mental Health Therapy Progress Note    Patient has not attended any groups thus far today.   Will continue to encourage group attendance and participation in order to increase positive coping skills and express feelings regarding current situation.

## 2017-03-01 NOTE — Plan of Care (Signed)
Problem: At Risk for Harm to Self: AS EVIDENCED BY...  Goal: Identifies stressors, protective factors, and coping strategies  Outcome: Not Progressing   03/01/17 0011   Goal/Interventions addressed this shift   Identifies stressors, protective factors and coping strategies Assist patient to identify stressors, protective factors, and develop healthy coping strategies for self-destructive feelings;Assist patient to identify trigger(s) for self-harm behavior;Reinforce patient's use/efforts to use healthy coping strategies during hospitalization   Patient calm and cooperative with care. encouraged to attend the group therapy during the day but Pt did not answer. Denied SI/HI but still hearing voices, stating "I don't understand what they said and who they are, just mumbling sound". Patient in bed with eyes closed but easily alert with environmental stimuli. Soft voice and flat affect noted. Med compliant during shift. Mood "ok".

## 2017-03-01 NOTE — Plan of Care (Signed)
Problem: At Risk for Harm to Self: AS EVIDENCED BY...  Goal: Reduction of self-harm thoughts and no attempts  Outcome: Progressing   03/01/17 1020   Goal/Interventions addressed this shift   Reduction of self-harm thoughts and no attempts Assess patient for thoughts or intent of self-harm;Educate patient to ask for help when experiencing increased thoughts to harm self   Vadhir reports that the goal for today 03/01/2017 is to "To feel better." He was resting in his room this AM and he denies SI/HI and AVH. Med compliant and calm, cooperative upon approach. During treatment plan, discontinued clonazePAM (KlonoPIN) tablet 0.5 mg and OLANZapine zydis (ZyPREXA ZYDIS) disintegrating tablet 5 mg. Now started risperiDONE (RisperDAL) tablet 1 mg. He was also encouraged to get out of bed and attend groups.

## 2017-03-01 NOTE — UM Notes (Signed)
Patient Class: Inpatient   Admission Date: 02/26/17  Admission Time per MD order: 2215  Type of Review: Initial       PER EMR    Patient admitted from the ED voluntary   Patient BIBA (patient called after smoking one blunt of K2). Patient reports experiencing command hallucinations to hurt himself and others. Patient reports being unable to contract for safety outside the hospital. Patient had to be given Haldol 5 mg IV in the ED     Axis I: Psychosis F29, Polysubstance dependence F19.20  Axis II: deferred  Axis III: none    V.S:61-19-140/85-97%     Precipitant: substance use       Past psych hx: Multiple Inpatient psychiatric admissions     Psychiatrist: unclear at the time of admission     Therapist: unclear at the time of admissons    Home medications  Klonopin 0.5 mg PO BID  Cymbalta 40 mg PO daily   Risperdal 4 mg  PO QHS     Social hx: patient states homeless     Substance abuse hx : K2- patient has a long hx of polysubstance dependence   UDS-negative   BAL- none detected      Mental Status:  PSYCHIATRIC SPECIALTY EXAMINATION      1-5 bullets:  problem focused   At least 6 bullets: expanded problem focused    At least 9 bullets:  detailed   All bullets: comprehensive exam   [x]  Vital Signs:      Vitals       Vitals:    02/27/17 0812   BP: 112/73   Pulse: 70   Resp:    Temp: 98.6 F (37 C)   SpO2: 97%           General Appearance and Manner:        [x] age appropriate    [] bearded    [] casually dressed      [] deviant     [] cooperative   [x] disheveled    [] older than stated age    [] overweight     [] piercings    [x] tattooed    [] thin & gaunt looking    [] well dressed    [] younger than stated age     [] good eye contact    [x] avoidant eye contact    [] hesitant     Musculoskeletal:   [] normal     [] tremor     [] rigidity     [] flaccid       [] akathesia    [] choreaoathetoid movt      [] tics    Gait:   [x] normal gait     [] gait abnormality_______       Mood:   []  fine      [] euthymic     [x] anxious       [x] sad       [x] depressed      [x] angry       [] euphoric      [] irritable     Affect:   []  full range       [x] constricted        [x] blunted         [] flat          [] bizarre   Speech:   [] Normal pitch       [] normal volume      [] articulation error      [x] delayed      [] increased latency of response       [] loud       []   pressured      [] profane       [] soft       [] perseveration   Thought processes:    [] Normal     []  goal directed     [] logical     []  illogical     [x] flight of ideas      [x] disorganized     [] concrete      []  associations intact      []  abstract reasoning intact  Description of associations:   []  loose     [] circumstantial      [] concrete      [] tangential      []  intact   Thought content:  [] NO delusions      [x] delusions     [] persecutory       [x] referential       [] religious       [] grandiose      [] bizarre        Safety:  []  absent of suicidal or homicidal ideation       [] suicidal ideation      [] suicidal plan      [] suicidal intent      [] passive suicidal ideation      [] homicidal ideation       [] homicidal plan      [] homicidal intent   []  actively trying to hurt self       []  agitation       []  preoccupation with violence   Perception:   [] NO hallucination(s)      [x] hallucination(s)    [x] auditory      [x] visual     [] tactile      [] olfactory      [] gustatory       Orientation:     [] fully oriented               [x] disoriented to:  [] time   []  Person   [] Place    [x] Situation   Memory :  []  grossly intact     []  immediate memory deficit      [x]  recent memory deficit      [x] remote memory deficit    [] MMSE_________    [] MOCA__________     Attention/Concentration:   [] normal       [x]  distractible        [] inattentive   Language:   [] age appropriate        [x]  naming okay       []  repetition    Fund of knowledge:   [] age appropriate       []  adequate       [x] in adequate       []  above average   Judgment & Insight:  []  intact      []  limited      [x]  fair      [x] significantly lacking      []  description_________   Other findings:          Medications ordered by MD:  Klonopin 0.5 mg PO BID  Zyprexa 5 mg PO BID   Seroquel 25 mg PO QHS     Tx plan  Q 15 min checks   Safety Plan  W[facility] The[facility]ies  Medication Management and stabilization  Collaboration with outpatient providers and support system      Discharge planning  Shelter     ELOS: 3-5 days     Danie Chandler, BSN   UM Case Manager  [employer]   [phone]

## 2017-03-01 NOTE — Treatment Plan (Signed)
Interdisciplinary Treatment Plan Update Meeting    03/01/2017  [name]    Participants:  Patient:  [name]  Attending Physician:  Erin Sons, MD  RN: Roger Shelter    Objective:  Review response to treatment, reassess needs/goals, update plan as indicated incorporating patient's strengths and stated needs, goals, and preferences.    1. Summary of Patient Progress on Treatment Plan Goals:  [name] reports that the goal for today 03/01/2017 is to "To feel better." He was resting in his room this AM and he denies SI/HI and AVH. Med compliant and calm, cooperative upon approach. During treatment plan, discontinued clonazePAM (KlonoPIN) tablet 0.5 mg and OLANZapine zydis (ZyPREXA ZYDIS) disintegrating tablet 5 mg. Now started risperiDONE (RisperDAL) tablet 1 mg. He was also encouraged to get out of bed and attend groups, which he did later on the day. Also there is lab order    2. Level of Patient Involvement:  Actively engaged/contributing    3. Patient Understanding of Plan of Care:  Able to verbalize goals and interventions    4. Level of Agreement/Commitment to Plan of Care:  Agrees with and is committed to plan of care      Contributor Signatures:      MD_________________________________ Date___________________    SW_________________________________Date ___________________    RN _________________________________Date____________________    Other________________________________Date ___________________    (This document is signed electronically by Clinical research associate and electronic co-signer.  Other participants sign a printed copy which is scanned into the EMR)

## 2017-03-01 NOTE — Progress Notes (Signed)
Patient in bed with eyes closed. No distress noted. Patient slept though the night without waking up during shift. Continue on safety monitor in place during shift.

## 2017-03-01 NOTE — Plan of Care (Signed)
Problem: At Risk for Harm to Self: AS EVIDENCED BY...  Goal: Attends a minimum number of therapies daily  Outcome: Not Progressing  Daily Group Attendance Note:      OrientationandGoalsGroup:AttendedNo      Creative Therapy/Art Therapy:Attended No      Group Therapy :Attended No      Specialty Group:Attended No      Evening Group:Attended No

## 2017-03-01 NOTE — Plan of Care (Addendum)
Problem: At Risk for Harm to Self: AS EVIDENCED BY...  Goal: Completes discharge safety and recovery plan  Outcome: Not Progressing  SW met with patient to introduce self. Pt is a 26 year-old single, homeless and unemployed A/A male admitted with symptoms of psychoticism and K2 use. Pt is somewhat guarded. Pt did not elaborate on circumstances leading up to hospitalization. Pt has had several inpatient psychiatric hospitalizations with similar symptoms. Pt has a longstanding history of MDD and Unspecified Schizophrenia Spectrum and other psychotic disorder with r/o: substance induced mood disorder and malingering. Pt is not currently receiving outpatient psychiatric services. Pt has a substance abuse history of K2. There is no reported history of substance abuse treatment of any modality. Pt reports that he resides with non-relatives in [place]. SW will continue to monitor, refer patient to [facility] in [place], [place] and be available for discharge planning.

## 2017-03-02 DIAGNOSIS — F29 Unspecified psychosis not due to a substance or known physiological condition: Secondary | ICD-10-CM

## 2017-03-02 LAB — BASIC METABOLIC PANEL
BUN: 10 mg/dL (ref 9.0–28.0)
CO2: 23 mEq/L (ref 21–29)
Calcium: 9.3 mg/dL (ref 8.5–10.5)
Chloride: 107 mEq/L (ref 100–111)
Creatinine: 1 mg/dL (ref 0.5–1.5)
Glucose: 85 mg/dL (ref 70–100)
Potassium: 4.2 mEq/L (ref 3.5–5.1)
Sodium: 140 mEq/L (ref 136–145)

## 2017-03-02 LAB — HEMOGLOBIN A1C
Average Estimated Glucose: 114 mg/dL
Hemoglobin A1C: 5.6 % (ref 4.6–5.9)

## 2017-03-02 LAB — GFR: EGFR: >60

## 2017-03-02 LAB — HEMOLYSIS INDEX: Hemolysis Index: 37 — ABNORMAL HIGH (ref 0–18)

## 2017-03-02 NOTE — Plan of Care (Signed)
Problem: At Risk for Harm to Self: AS EVIDENCED BY...  Goal: Attends a minimum number of therapies daily  Outcome: Progressing  Daily Group Attendance Note:      OrientationandGoalsGroup:AttendedYes      Creative Therapy/Art Therapy:Attended Yes      Group Therapy :Attended No      Specialty Group:Attended Yes      Evening Group:Attended Yes

## 2017-03-02 NOTE — Progress Note - Problem Oriented Charting Notewrit (Signed)
Psychiatry Progress Note  (Level 1 = Problem Focused, Level 2 = Expanded Problem Focused, Level 3 = Detailed)    Patient Name: [name]            Current Date/Time:  11/20/201810:29 AM  MRN:  [id number]                            Attending Physician: Erin Sons, MD  DOB: 1990/05/16                                Gender: male                              I. History   Informants:  Interview with the patient, review of available medical records, discussion with the treatment team on the unit message left for Dr Jerre Simon MD  A. Chief Complaint or Reason for Admission      Psychotic, delusional, paranoid, substance-induced psychosis   B.History of Present Illness: Interval History     (Symptoms and qualifiers:1 for Level 1, 2 for Level 2 and 3+ for Level 3)  26 yo M presents in acute psychosis in context of mental illness (SIMD/SIPD vs MDD vs schizoaffective), substance use (K2) and psychosocial stressors (homless, unemployed).  NAEON, but attended 0/5 groups.  However, patient plans to attend groups today.  Continues to endorse CAH to kill self but does not wish to kill self or others.  Endorses seeing people outside of his window which he later clarifies are actually objects, such as the metal pipes, and states that this started after smoking K2.  Endorses decreased interested and energy but otherwise denies feeling depressed/sad, feeling guilty or worthless, or psychomotor retardation or SI; reports "ok" sleep and appetite.  Denies any prior hospitalizations or episodes of psychosis.  When asked about outpatient provider Dr. Jerre Simon, states that Dr. Jerre Simon came to visit yesterday and they discussed his AH.    Collateral from Director [facility]:  Patient was referred to [facility] from [facility] in August 2017 and has remained in day program and residential treatment program.  Patient was diagnosed with MDD recurrent severe with psychotic features for which he was prescribed haldol  decanoate, lexapro and paliperidone.  Patient was stable on medications, not psychotic or depressed, interacting well with other patient, coherent, oriented, properly behaved.  For unknown reasons, walked out on Monday (possibly because [facility] was managing his finances) and [facility] called police/is happy to know that he is in the hospital.      C.Medical History  Review of Systems  (Level 1 is none, Level 2 is 1, and Level 3 is 2+)  Poorly cooperative for 14 point medical review of systems    D.Additional Past Psychiatric, Substance Use, Medical, Family and Social History  Psychiatric: No new information available as compared to previous encounter(s)  Substance Use: No new information available as compared to previous encounter(s)  Medical: No new information available as compared to previous encounter(s)  Family: No new information available as compared to previous encounter(s)  Social: No new information available as compared to previous encounter(s)    Medications:   Current Medications    Scheduled     Medication Dose/Rate, Route, Frequency Last Action    risperiDONE (RisperDAL) tablet 2 mg 2 mg, PO, Q12H Given: 11/20  0934         PRN     Medication Dose/Rate, Route, Frequency Last Action    acetaminophen (TYLENOL) tablet 650 mg 650 mg, PO, Q6H PRN Ordered    alum & mag hydroxide-simethicone (MAALOX PLUS) 200-200-20 mg/5 mL suspension 30 mL 30 mL, PO, Q4H PRN Ordered    diphenhydrAMINE (BENADRYL) capsule 50 mg 50 mg, PO, Q6H PRN Ordered    diphenhydrAMINE (BENADRYL) injection 50 mg 50 mg, IM, Q6H PRN Ordered    docusate sodium (COLACE) capsule 100 mg 100 mg, PO, QD PRN Ordered    haloperidol (HALDOL) tablet 5 mg 5 mg, PO, Q6H PRN Ordered    haloperidol lactate (HALDOL) injection 5 mg 5 mg, IM, Q6H PRN Ordered    LORazepam (ATIVAN) injection 2 mg 2 mg, IM, Q6H PRN Ordered    LORazepam (ATIVAN) tablet 2 mg 2 mg, PO, Q6H PRN Ordered    traZODone (DESYREL) tablet 50 mg 50 mg, PO, QHS PRN Ordered                II.  Examination   Vital signs reviewed:   Blood pressure 110/67, pulse 60, temperature 97.5 F (36.4 C), temperature source Oral, resp. rate 18, height 1.676 m (5\' 6" ), weight 99.8 kg (220 lb), SpO2 96 %.     Mental Status Exam  (Level 1 is 1-5, Level 2 is 6-8, Level 3 is 9+)  General appearance: Well-developed, clean-shaven African-American male with a large him multiple tattoos all over his body, sitting in bed in no acute distress  Attitude/Behavior: cooperative, quiet  Motor: psychomotor retardation  Gait: Appeared to be normal  Muscle strength and tone: Appeared to be normal  Speech:  Slow, clear, yes or no answers  Mood: euthymic  Affect:  Flat  Thought Process:  Patient is not suicidal, not homicidal.  Not in distress  Associations: Impoverished  Thought Content:  See above  Perceptions: see above  Insight: Impaired  Judgment: Impaired  Cognition:  Cognitive and memory functions appeared to be superficially intact    Psychiatric / Cognitive Instruments: None    Pertinent Physical Exam: Physical examination is essentially unremarkable from the previous examination    Imaging / EKG / Labs: Labs are essentially unremarkable from the previous examination    III. Assessment and Plan (Medical Decision Making)     1. I certify that this patient continues to require inpatient hospitalization due to His current condition and intoxication with psychoactive substances and also previously diagnosed psychiatric disorder    2. Psychiatric Diagnoses   Axis I    Schizophrenia, chronic 295.90   Substance induced mood disorder, psychotic disorder   Polysubstance dependence   K-2 dependency   Axis II    Deferred 799.90   Cluster B personality disorder   Axis III  History reviewed. No pertinent past medical history.  History reviewed. No pertinent surgical history.   Axis IV   Problems with primary support group   Axis V   Current GAF: 11-20: some danger of hurting self or others possible OR occasionally fails to  maintain minimal personal hygiene OR gross impairment in communication.    3.All diagnostic procedures completed since admission were reviewed. Past medical records reviewed. Coordination of care was discussed with inpatient team and as available with the outpatient team.    4. On this admission patient educated about and provided input into their treatment plan.  Patient understands potential risks and benefits of proposed treatment plan.  5.  Assessment / Impression  26 yo M presents in acute psychosis in context of mental illness (SIMD/SIPD vs MDD vs schizoaffective), substance use (K2) and psychosocial stressors (homless, unemployed).  Psychosis improving somewhat with abstinence and antipsychotic treatment; most likely substance-induced.  Pending collateral from Dr. Jerre Simon.    Suicide Risk Assessment   Suicide Risk Assessment performed?   Patient is not a suicidal risk at this time on the unit  Plan / Recommendations:    Biological Plan:  Medications:  Continue Risperdal BID  Medical Work-up: none  Consults: none    Psychosocial Plan:  Individual therapy: Psycho-education and psychotherapy of the following modalities will continue to be provided during daily visits  Group and milieu therapies: daily per unit's schedule.  Continue with Social Work intervention provided for discharge planning including assistance with .    Plan for Family Involvement:   Other Providers Contact Information and Dates Contacted:     Total Attending time spent 35 minutes (floor time) with more than 50 percent of time in direct patient contact, coordinating care and counseling.    Signed by: Buck Mam, MD  03/02/2017  10:29 AM     I have read the note of the resident and agree fully. Pt wishes to cont lexparo. Very poor hx as he says he has never been here before and denies being on haldol. Await call from Dr Jerre Simon.  [facility]

## 2017-03-02 NOTE — Plan of Care (Signed)
Problem: At Risk for Harm to Self: AS EVIDENCED BY...  Goal: Completes discharge safety and recovery plan  Outcome: Progressing  SW met with patient @ bedside. Pt's mood is calm. Pt denies SI/HI. Pt has not attended group therapies thus far. Pt is compliant with psychiatric drug treatment. SW will refer patient to [facility] for outpatient mental health services. SW will continue to monitor, provide support and be available for discharge planning.

## 2017-03-02 NOTE — Plan of Care (Signed)
Pt is A+OX4.Patient sleeping this shift but easily awake.VSS and medication-compliant.Pt described mood being "average." and denies SI/HI/AVH.Encouraged to get out of the room and watch movie.Continue to monitor pt for safety.

## 2017-03-02 NOTE — Plan of Care (Addendum)
Problem: At Risk for Harm to Self: AS EVIDENCED BY...  Goal: Attends a minimum number of therapies daily  Outcome: Progressing  Mental Health Therapy Progress Note    Patient has attended two groups thus far today.  In orientation and goals group, pt reported that his goal of the day is to get better. Pt was pleasant and quiet.  Pt attended creative therapy and worked on an Event organiser.  Pt interacted appropriately with others. Will continue to monitor, assess and encourage group attendance.

## 2017-03-02 NOTE — UM Notes (Signed)
Risperdal 2 mg PO BID- started on 05/01/16    Patient endorses decreased interest and energy.  Patient isolative to room   Endorses passive AH VH- reports voices are saying to hurt himself and VH- seeing dangerous people     DX: Schizophernia F20  Substance induced mood disorder F19.94  Psychotic Disorder F29  Po    Mental Status  General appearance: Well-developed, clean-shaven African-American male with a large him multiple tattoos all over his body, sitting in bed in no acute distress  Attitude/Behavior: cooperative, quiet  Motor: psychomotor retardation  Gait: Appeared to be normal  Muscle strength and tone: Appeared to be normal  Speech:  Slow, clear, yes or no answers  Mood: euthymic  Affect:  Flat  Thought Process:  Patient is not suicidal, not homicidal.  Not in distress  Associations: Impoverished  Thought Content:  See above  Perceptions: see above  Insight: Impaired  Judgment: Impaired  Cognition:  Cognitive and memory functions appeared to be superficially intact        Not attending groups   No outpatient services   Patient reports residing with non-relative in [place]    Dispo- Referral to [facility] in [place], [place]

## 2017-03-02 NOTE — Progress Notes (Signed)
Patient was walking around the unit and after taking his vital signs, everything was fine except his heart rate was 131 and re-check gain, and his heart rate back to 95 and no sign of distress noticed. He was calm and composed. Continue to monitor.

## 2017-03-02 NOTE — Plan of Care (Signed)
Problem: At Risk for Harm to Self: AS EVIDENCED BY...  Goal: Reduction of self-harm thoughts and no attempts  Outcome: Progressing   03/02/17 1035   Goal/Interventions addressed this shift   Reduction of self-harm thoughts and no attempts Educate patient to ask for help when experiencing increased thoughts to harm self;Assess patient for thoughts or intent of self-harm   [name] reports that the goal for today 03/02/2017 is to go to at least one group. Patient was in his room this AM and encouraged to get out of his room and participate with groups, which he did. He endorses passive AVH and denies HI/SI. He said the voices are saying to hurt himself and also seeing dangerous people. He was educated to seek help as soon as possible to staff and he was checked every 15 minutes for safety and also approached several times to check on him. Also, he was asked to go to art therapy which he did also. Med compliant and continue to monitor.

## 2017-03-02 NOTE — Progress Notes (Signed)
Pt resting in bed with eyes closed throughout the night. Safety maintained. Will continue to monitor for safety and support.

## 2017-03-03 MED ORDER — ESCITALOPRAM OXALATE 10 MG PO TABS
10.0000 mg | ORAL_TABLET | Freq: Every day | ORAL | Status: DC
Start: 2017-03-03 — End: 2017-03-03

## 2017-03-03 MED ORDER — ESCITALOPRAM OXALATE 10 MG PO TABS
10.0000 mg | ORAL_TABLET | Freq: Once | ORAL | Status: AC
Start: 2017-03-03 — End: 2017-03-03
  Administered 2017-03-03: 11:00:00 10 mg via ORAL
  Filled 2017-03-03: qty 1

## 2017-03-03 MED ORDER — ESCITALOPRAM OXALATE 10 MG PO TABS
20.0000 mg | ORAL_TABLET | Freq: Every day | ORAL | Status: DC
Start: 2017-03-04 — End: 2017-03-12
  Administered 2017-03-04 – 2017-03-12 (×9): 20 mg via ORAL
  Filled 2017-03-03 (×9): qty 2

## 2017-03-03 NOTE — Plan of Care (Signed)
Problem: At Risk for Harm to Self: AS EVIDENCED BY...  Goal: Reduction of self-harm thoughts and no attempts  Outcome: Progressing   03/03/17 0505   Goal/Interventions addressed this shift   Reduction of self-harm thoughts and no attempts Assess presence/provide wound care as needed;Double gown search/belongings search per MD order;Monitor the use of bathroom;1:1 constant observation per MD order;Assign patient to room that allows closer observation;Room search per physicians order;Educate patient to ask for help when experiencing increased thoughts to harm self;Complete inpatient safety plan;Assess patient for thoughts or intent of self-harm;Provide finger foods with no utensils     Goal: Completes discharge safety and recovery plan  Outcome: Progressing      Comments: Pt isolative to his room. Calm, cooperative, med compliant. Pt denies HI/AVH, but reports passive SI and was able to contract for safety. He reports that he is "doing better." Safety maintained, will continue to monitor for SI and safety.

## 2017-03-03 NOTE — Plan of Care (Signed)
Problem: At Risk for Harm to Self: AS EVIDENCED BY...  Goal: Completes discharge safety and recovery plan  Outcome: Progressing  Pt reported that he is currently not receiving outpatient mental health services, and was subsequently referred by SW to [facility]. SW verified that patient is active with [facility] @ [address], [address], [place], [place] [address], T#:[phone], F#:[phone]. Pt missed recent appointment (this week) with Dr. Omelia Blackwater and rescheduled on 11/27 @ 1:30 pm. SW will continue to monitor, provide support and be available for discharge planning.

## 2017-03-03 NOTE — Plan of Care (Signed)
Problem: At Risk for Harm to Self: AS EVIDENCED BY...  Goal: Attends a minimum number of therapies daily  Outcome: Not Progressing  Mental Health Therapy Progress Note    Patient has not attended any groups thus far today.   Will continue to encourage group attendance and participation in order to increase positive coping skills and express feelings regarding current situation.

## 2017-03-03 NOTE — Progress Notes (Signed)
Pt resting comfortably in bed with eyes closed. Slept throughout the night. Safety maintained.

## 2017-03-03 NOTE — Plan of Care (Signed)
Problem: At Risk for Harm to Self: AS EVIDENCED BY...  Goal: Attends a minimum number of therapies daily  Outcome: Progressing   03/03/17 1206   Goal/Interventions addressed this shift   Attends a minimum number of therapies daily  Identify target number of therapies patient will attend daily as appropriate to functioning level;Encourage attendance and reinforce small successes in participation   Patient remains visible in the unit. Denies SI/HI/AVH. Patient states "mood is ok" Patient appear calm and cooperative upon approach presented with depressed affect. Patient accepted scheduled medication.Patient was unable to state the Goal for the Day. Patient was encouraged to attend the groups. We will continue to monitor for safety.
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
## 2017-03-03 NOTE — Plan of Care (Signed)
Problem: At Risk for Harm to Self: AS EVIDENCED BY...  Goal: Attends a minimum number of therapies daily  Daily Group Attendance Note:      OrientationandGoalsGroup:AttendedNo      Creative Therapy/Art Therapy:Attended No      Group Therapy :Attended No      Specialty Group:Attended No      Evening Group:Attended No

## 2017-03-04 MED ORDER — HALOPERIDOL 5 MG PO TABS
5.0000 mg | ORAL_TABLET | Freq: Two times a day (BID) | ORAL | Status: DC
Start: 2017-03-04 — End: 2017-03-12
  Administered 2017-03-04 – 2017-03-12 (×16): 5 mg via ORAL
  Filled 2017-03-04 (×16): qty 1

## 2017-03-04 NOTE — Progress Notes (Signed)
Pt resting comfortably in bed with eyes closed. Slept throughout the night. Safety maintained.

## 2017-03-04 NOTE — Plan of Care (Signed)
Problem: At Risk for Harm to Self: AS EVIDENCED BY...  Goal: Attends a minimum number of therapies daily  Outcome: Not Progressing  Mental Health Therapy Progress Note    Patient has not attended any groups thus far today.   Will continue to encourage group attendance and participation in order to increase positive coping skills and express feelings regarding current situation.

## 2017-03-04 NOTE — Plan of Care (Signed)
Problem: At Risk for Harm to Self: AS EVIDENCED BY...  Goal: Patient's recovery goal in his/her own words:  Outcome: Progressing   03/04/17 1500   Goal/Interventions addressed this shift   Patient's recovery goal in his/her own words:  Assist to identify goals for treatment based on individual needs and strengths   Patient remains visible in the unit. Denies SI/HI/AH. Patient states, "i see people floating around in front of me who are not really there." Patient presents as calm and cooperative upon approach with a flat guarded affect. Patient is compliant with scheduled medication. Patient identified a Goal for the Day, " To go to all groups today." Patient encouraged to attend the groups. Will continue to monitor for safety.

## 2017-03-04 NOTE — Plan of Care (Signed)
Problem: At Risk for Harm to Self: AS EVIDENCED BY...  Goal: Attends a minimum number of therapies daily  Outcome: Not Progressing  Daily Group Attendance Note:      OrientationandGoalsGroup:AttendedNo      Creative Therapy/Art Therapy:Attended No      Group Therapy :Attended No      Specialty Group:Attended No      Evening Group:Attended No
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
## 2017-03-04 NOTE — Plan of Care (Signed)
Problem: At Risk for Harm to Self: AS EVIDENCED BY...  Goal: Reduction of self-harm thoughts and no attempts  Outcome: Progressing   03/04/17 0621   Goal/Interventions addressed this shift   Reduction of self-harm thoughts and no attempts Assess presence/provide wound care as needed;Double gown search/belongings search per [place] order;Monitor the use of bathroom;1:1 constant observation per [place] order;Provide finger foods with no utensils;Assign patient to room that allows closer observation;Room search per physicians order;Educate patient to ask for help when experiencing increased thoughts to harm self;Assess patient for thoughts or intent of self-harm;Complete inpatient safety plan       Comments: Pt isolative to his room. Pt states his mood is "good." He is calm, cooperative, and med compliant. Flat affect and slow/delayed speech. Pt denies SI/HI/AVH. Safety maintained, will continue to monitor for SI, hallucinations, and safety.

## 2017-03-05 NOTE — Plan of Care (Signed)
Problem: At Risk for Harm to Self: AS EVIDENCED BY...  Goal: Attends a minimum number of therapies daily  Outcome: Progressing  Mental Health Therapy Progress Note  Patient has attended one group today thus far. The patient attended Creative Therapy briefly, but declined to join the other groups. Will continue to encourage group attendance and participation in order to increase positive coping skills and express feelings regarding current situation.

## 2017-03-05 NOTE — Plan of Care (Signed)
Patient staying in room resting this shift. Appears to be drowsy but easily awake with verbal stimuli. Denies SI/HI/AVH. Medication-compliant. Continue to monitor pt for safety.
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
## 2017-03-05 NOTE — Plan of Care (Signed)
Problem: At Risk for Harm to Self: AS EVIDENCED BY...  Goal: Attends a minimum number of therapies daily  Outcome: Progressing  Daily Group Attendance Note:      OrientationandGoalsGroup:AttendedNo      Creative Therapy/Art Therapy:Attended Yes      Group Therapy :Attended No      Specialty Group:Attended No      Evening Group:Attended N/A

## 2017-03-05 NOTE — Progress Notes (Signed)
Pt resting in bed with eyes closed throughout the night. Safety maintained. Will continue to monitor for safety and support.

## 2017-03-05 NOTE — Plan of Care (Signed)
Problem: At Risk for Harm to Self: AS EVIDENCED BY...  Goal: Patient's recovery goal in his/her own words:  Outcome: Progressing   03/05/17 1457   Goal/Interventions addressed this shift   Patient's recovery goal in his/her own words:  Assist to identify goals for treatment based on individual needs and strengths   Patient remains visible in the unit. Denies SI/HI/AVH. Patient states, I like art therapy and am going to groups today.' Patient presents ascalm and cooperative upon approach with a flat guarded affect.Patient is compliant withscheduled medication.Patient identified aGoal for the Day, " To go to all groups again today." Patient wasencouraged to attend the groups. Will continue to monitor for safety.

## 2017-03-05 NOTE — Plan of Care (Signed)
Problem: At Risk for Harm to Self: AS EVIDENCED BY...  Goal: Completes discharge safety and recovery plan  Outcome: Progressing  SW met with patient @ bedside. Pt is minimally engaged with SW. Pt is not consistent with attendance @ group therapies. Pt is compliant with psychiatrist drug treatment. The discharge plan is for patient to return to [place] and follow up with outpatient [facility] with Dr. Alleen Borne on 11/27 @ 1:30 pm. SW will continue to monitor and assist patient accordingly.

## 2017-03-06 NOTE — Plan of Care (Signed)
Problem: At Risk for Harm to Self: AS EVIDENCED BY...  Goal: Attends a minimum number of therapies daily  Daily Group Attendance Note:      OrientationandGoalsGroup:AttendedNo      Creative Therapy/Art Therapy:Attended Yes      Group Therapy :Attended No      Specialty Group:Attended No      Evening Group:Attended N/A

## 2017-03-06 NOTE — Progress Notes (Signed)
Pt observed in her room lying down appear to be sleeping. Pt. stable with no distress and continue to be monitor. Safety maintain.
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
## 2017-03-06 NOTE — Plan of Care (Signed)
Problem: At Risk for Harm to Self: AS EVIDENCED BY...  Goal: Reduction of self-harm thoughts and no attempts  Outcome: Progressing   03/05/17 2100   Goal/Interventions addressed this shift   Reduction of self-harm thoughts and no attempts Complete inpatient safety plan;Assess patient for thoughts or intent of self-harm;Educate patient to ask for help when experiencing increased thoughts to harm self;Room search per physicians order;Assign patient to room that allows closer observation;Assess presence/provide wound care as needed   Pt. observed in his room lying down with eyes open. Pt. verbalize hearing voices telling him to harm himself, but denies plan.  Pt. accepted schedule medications and no adverse reaction noted. Pt. calm, cooperative and cooperative upon approach with flat guarded affect. Pt. stable with no distress and continue to be monitor. Safety maintain.

## 2017-03-06 NOTE — Plan of Care (Signed)
Problem: At Risk for Harm to Self: AS EVIDENCED BY...  Goal: Reduction of self-harm thoughts and no attempts  Outcome: Progressing   03/06/17 1218   Goal/Interventions addressed this shift   Reduction of self-harm thoughts and no attempts Complete inpatient safety plan;Assess patient for thoughts or intent of self-harm;Educate patient to ask for help when experiencing increased thoughts to harm self   Patient remains mostly isolative to his room, patient Denies SI, present with the AVH. Patient states "I see the people and I heard the voice telling me tohurt myself". Patient was verbally contracted for the safety,patient stated "I feel safe in the unit" and will come toward the writer if the patient had feeling to harm himself.patient appear calm and cooperative upon approach presented with the flat affect.Patient was encouraged to attend the groups. We will continue to monitor for safety and continue to encouraged patient to attend groups

## 2017-03-06 NOTE — Plan of Care (Signed)
Problem: At Risk for Harm to Self: AS EVIDENCED BY...  Goal: Attends a minimum number of therapies daily  Outcome: Progressing  Mental Health Therapy Progress Note    Patient has attended one group thus far today. He worked on making a bracelet in creative therapy. His mood was appropriate, affect even. Will continue to monitor, assess and encourage group attendance.

## 2017-03-07 NOTE — Plan of Care (Signed)
Problem: At Risk for Harm to Self: AS EVIDENCED BY...  Goal: Reduction of self-harm thoughts and no attempts  Outcome: Progressing   03/07/17 1022   Goal/Interventions addressed this shift   Reduction of self-harm thoughts and no attempts Complete inpatient safety plan;Assess patient for thoughts or intent of self-harm;Educate patient to ask for help when experiencing increased thoughts to harm self   Patient remains mostly isolative to his room. Patient denies SI/HI/AVH. Patient states "mood is ok", patient appear cooperative upon approach present with the flat affect. Patient Denies pain at this time. Patient was unable to state goal for the day. Patient was encouraged to attend the Groups. We will continue to monitor for safety.

## 2017-03-07 NOTE — Plan of Care (Addendum)
Patient was isolative to room. Patient was anxious and stated that command hallucinations are the cause of his anxiety. Patient understands that ativan can help him with his anxiety and he accepted a 2mg  dose with good results.   Denies desire for suicide, denies thoughts of harming others. Patient is pleasant and cooperative but minimally verbal with flat affect. Trazodone 50mg  given as sleep aid with good effect.

## 2017-03-07 NOTE — Progress Notes (Signed)
Patient was briefly visible in the unit during the day. Appear with the smiley face. We will continue to monitor for safety.

## 2017-03-07 NOTE — Plan of Care (Signed)
Problem: At Risk for Harm to Self: AS EVIDENCED BY...  Goal: Attends a minimum number of therapies daily  Outcome: Not Progressing  Mental Health Therapy Progress Note  Pt is isolative to his room.Patient has not attended any groups thus far today.   Will continue to encourage group attendance and participation in order to increase positive coping skills and express feelings regarding current situation.
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
## 2017-03-07 NOTE — Plan of Care (Signed)
Problem: At Risk for Harm to Self: AS EVIDENCED BY...  Goal: Reduction of self-harm thoughts and no attempts  Outcome: Progressing   03/07/17 2346   Goal/Interventions addressed this shift   Reduction of self-harm thoughts and no attempts Educate patient to ask for help when experiencing increased thoughts to harm self;Assess patient for thoughts or intent of self-harm   Patient has been calm and cooperative with care. Patient presents with a depressed mood and flat affect. He has remained isolative to his room, observed sleeping most of the evening. He denies SI/HI but reports visual hallucination of "seeing people."  Compliant with bedtime medication. Will continue to monitor patient for safety.

## 2017-03-07 NOTE — Plan of Care (Signed)
Problem: At Risk for Harm to Self: AS EVIDENCED BY...  Goal: Attends a minimum number of therapies daily  Outcome: Not Progressing  Daily Group Attendance Note:      OrientationandGoalsGroup:AttendedNo      Creative Therapy/Art Therapy:Attended No      Group Therapy :Attended No      Specialty Group:Attended No      Evening Group:Attended No

## 2017-03-08 NOTE — Plan of Care (Signed)
Problem: At Risk for Harm to Self: AS EVIDENCED BY...  Goal: Attends a minimum number of therapies daily  Outcome: Progressing  MENTAL HEALTH THERAPY ASSESSMENT/PROGRESS NOTE      AFFECT/MOOD:  Blunted and robot stare    THOUGHT PROCESS:  Goal Directed, Concrete and OtherPreoccupied    THOUGHT CONTENT:  Within Normal Limits and Negative    INTERPERSONAL:  Discussed Issues, Attentive and Limited Insight    LEARNING INTERVENTIONS:    IDENTIFICATION OF FEELINGS  /PROBLEM SOLVING SKILLS:   Partially Achieved    COPING STRATEGIES:  Partially Achieved     Pt has blunted affect with a robot- like stare. Pt appears to be preoccupied, but is coming out of isolation. Pt attended 3 Unit groups so far today. In Goal- setting group Pt says he wants "to go to groups". Pt tried Group therapy for about 2 minutes and left. Pt attended Storytelling group and told a story about a man "who got lost at the fair" and his girlfriend left him; the next part was about an old man with a grey beard holding his head. Pt said he felt most connected to"lost at the fair"-peers could identify with the feeling of being lost.Others said they wanted to hear more from him; Pt was less guarded today.Will continue to encourage group attendance and participation in order to increase positive coping skills and express feelings regarding current situation.

## 2017-03-08 NOTE — Plan of Care (Addendum)
Problem: At Risk for Harm to Self: AS EVIDENCED BY...  Goal: Completes discharge safety and recovery plan  Outcome: Progressing  SW met with patient and observed patient sitting on edge of bed. Pt's mood is calm. Pt is minimally engaged with SW. Reportedly, patient continues to experience CAH. Pt attended a couple of group therapies, but mainly isolates in his assigned room. Pt is compliant with psychiatric drug treatment. SW will continue to monitor, provide support and be available for discharge planning.    SW rescheduled outpatient psychiatric appointment with Dr. Jerre Simon on 12/3 @ 2:30 pm. SW will continue to monitor, provide support and assist patient accordingly.

## 2017-03-08 NOTE — Plan of Care (Signed)
Problem: At Risk for Harm to Self: AS EVIDENCED BY...  Goal: Attends a minimum number of therapies daily  Outcome: Progressing  Daily Group Attendance Note:      OrientationandGoalsGroup:AttendedYes      Creative Therapy/Art Therapy:Attended No      Group Therapy :Attended Yes      Specialty Group:Attended Yes      Evening Group:Attended No

## 2017-03-08 NOTE — Plan of Care (Signed)
Interdisciplinary Treatment Plan Update Meeting    03/08/2017  Stevphen Meuse    Participants:  Patient:  Stevphen Meuse  Attending Physician:  Heywood Iles, NP  RN: Santo Held    Objective:  Review response to treatment, reassess needs/goals, update plan as indicated incorporating patient's strengths and stated needs, goals, and preferences.    1. Summary of Patient Progress on Treatment Plan Goals:  Patient is mostly isolative to room and to self. Patient is alert and oriented x4. Denies SI/HI/AVH. Denies pain. Patient describes mood as "better" and has a noted decrease in hallucinations. Patient presents with a flat affect and is slow to respond. Patient is calm and cooperative with care. Med compliant. Able to make needs known. Will continue to monitor for hallucinations.    2. Level of Patient Involvement:  Actively engaged/contributing    3. Patient Understanding of Plan of Care:  Able to verbalize goals and interventions    4. Level of Agreement/Commitment to Plan of Care:  Agrees with and is committed to plan of care          Contributor Signatures:      MD_________________________________ Date___________________    SW_________________________________Date ___________________    RN _________________________________Date____________________    Other________________________________Date ___________________
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
## 2017-03-08 NOTE — UM Notes (Signed)
reason for continued stay- patient continues to experience CAH- patient isolative to his room     dx: Schizophernia  Substance induce mood disorder     mental status-   General appearance: Well-developed,  African-American male ,multiple tattoos all over his body, clean, wearing pt gown  Attitude/Behavior: cooperative, quiet, good eye contact   Motor: pt was observed walking; no abnormalities noted  Gait: Appeared to be normal  Muscle strength and tone: not tested   Speech:  Improved to normal tone, and rhythm   Mood: okay  Affect:  restricted  Thought Process:  Denied SI,  Denied HI  Associations: goal directed  Perceptions:   decreased AH still command   Denied VH  Insight: limited  Judgment: limited  Cognition:  Cognitive and memory functions appeared to be superficially intact    Current meds   Lexapro 20 mg PO daily   Haldol 5 mg PO BID   Ativan 2mg  PO Q6H PRN   Trazodone 50 mg PO QHS PRN      PRN's in the last 24hrs  None     Attending groups: patient didn't attend any groups on yesterday, 03/07/17     family involvement:  Reports resides with non relative in [place]- spoke with Grandmother      Tx plan:   Q 15 min checks  Encourage patient to attend groups  Medication management and stabilization  SW work with patient regarding discharge planning     Dispo: home with outpatient treatment     Expected length of Stay:2-3 days     Danie Chandler, BSN   UM Case Manager  [employer]   [phone]

## 2017-03-09 ENCOUNTER — Inpatient Hospital Stay: Payer: Medicaid Other

## 2017-03-09 NOTE — Plan of Care (Signed)
Problem: At Risk for Harm to Self: AS EVIDENCED BY...  Goal: Attends a minimum number of therapies daily  Outcome: Progressing  Daily Group Attendance Note:      OrientationandGoalsGroup:AttendedNo      Creative Therapy/Art Therapy:Attended Yes      Group Therapy :Attended No      Specialty Group:Attended No      Evening Group:Attended No

## 2017-03-09 NOTE — Plan of Care (Signed)
Problem: At Risk for Harm to Self: AS EVIDENCED BY...  Goal: Attends a minimum number of therapies daily  Outcome: Progressing  Mental Health Therapy Progress Note  Patient attended 1 group so far today. Patient worked quietly on his art project during creative therapy group. Patient was cooperative and stated he enjoyed making art. Will continue to encourage group participation.
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
## 2017-03-09 NOTE — Plan of Care (Signed)
Problem: At Risk for Harm to Self: AS EVIDENCED BY...  Goal: Reduction of self-harm thoughts and no attempts  Outcome: Progressing  Pt denies active SI or urges to harm self. Still is hearing voces but less frequently. Mood is depressed. Has been mostly isolative to his room, visible only at times; no groups this shift. Calm, cooperative compliant with medications. Had MRI done.Will continue to monitor.

## 2017-03-09 NOTE — Progress Notes (Signed)
Pt in bed with eyes closed and appears to be sleeping. Breathing and respirations with in normal limits as evidenced by the rise and fall of pt's chest. Continue to monitor for safety.

## 2017-03-09 NOTE — Plan of Care (Signed)
Problem: At Risk for Harm to Self: AS EVIDENCED BY...  Goal: Verbalizes understanding of medication, benefits, and side effects   03/09/17 0249   Goal/Interventions addressed this shift   Verbalizes understanding of medication, benefits, and side effects Ensure Informed consent for all psychotropic medications;Provide medication teaching including name, dosage, benefits, action, effect and side effects   Pt was received lying in bed with eyes closed and appears to be sleeping. Pt woke up at around 2200. Denies SI, HI, A/VH and pain. Pt took his medications with no problem. No behavioral issues observed. Continue to monitor for safety and assist as needed

## 2017-03-09 NOTE — Plan of Care (Signed)
Problem: At Risk for Harm to Self: AS EVIDENCED BY...  Goal: Reduction of self-harm thoughts and no attempts  Outcome: Progressing   03/09/17 1024   Goal/Interventions addressed this shift   Reduction of self-harm thoughts and no attempts Assess patient for thoughts or intent of self-harm;Educate patient to ask for help when experiencing increased thoughts to harm self   Denym reports that the goal for today 03/09/2017 is to go to at least one group. Patient is mostly isolative to room and to self. Calm and cooperative. Denies SI/HI/VH. Endorsed hearing voices that are telling him to hurt himself but he has no intention to act on voices and he feels safe on the unit. He contracted for safety verbally. Med compliant.

## 2017-03-10 DIAGNOSIS — R9401 Abnormal electroencephalogram [EEG]: Secondary | ICD-10-CM

## 2017-03-10 NOTE — Plan of Care (Addendum)
Problem: At Risk for Harm to Self: AS EVIDENCED BY...  Goal: Completes discharge safety and recovery plan  Outcome: Progressing  SW contacted [facility] and verified that patient will need to call the homeless shelter hotline @ [phone] to complete a telephone assessment. Pt may also contact [facility] [address] [facility] ([place] [facility]) @ [phone]. At that time, patient will be provided with shelter location and availability.     SW verified that patient has stable housing @ [address], [place], [place] [address].

## 2017-03-10 NOTE — Plan of Care (Signed)
Problem: At Risk for Harm to Self: AS EVIDENCED BY...  Goal: Identifies stressors, protective factors, and coping strategies  Outcome: Progressing   03/10/17 1619   Goal/Interventions addressed this shift   Identifies stressors, protective factors and coping strategies Assist patient to identify stressors, protective factors, and develop healthy coping strategies for self-destructive feelings;Assist patient to identify trigger(s) for self-harm behavior;Reinforce patient's use/efforts to use healthy coping strategies during hospitalization   Shailen reports that the goal for today 03/10/2017 is to go to at least one group. Patient was mostly in his room and he said that he has pain in his brain, and checked his vital signs which were stable and he got EEG done today. He denies SI/HI and AH but endorses VH, saying that he seeing people. He is clam, cooperative and depressed mood. Slow and soft spoken, cooperative and med compliant. Continue to monitor.
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
## 2017-03-10 NOTE — Plan of Care (Signed)
Problem: At Risk for Harm to Self: AS EVIDENCED BY...  Goal: Attends a minimum number of therapies daily  Outcome: Progressing  Daily Group Attendance Note:      OrientationandGoalsGroup:AttendedNo      Creative Therapy/Art Therapy:Attended Yes      Group Therapy :Attended No      Specialty Group:Attended No      Evening Group:Attended No

## 2017-03-10 NOTE — Plan of Care (Signed)
Problem: At Risk for Harm to Self: AS EVIDENCED BY...  Goal: Reduction of self-harm thoughts and no attempts  Outcome: Progressing   03/10/17 2245   Goal/Interventions addressed this shift   Reduction of self-harm thoughts and no attempts Educate patient to ask for help when experiencing increased thoughts to harm self;Assess patient for thoughts or intent of self-harm   Patient is calm and cooperative with care. He has been isolative to his room this shift. He spent most of the evening resting in bed. Describes his mood as "okay." He denies SI/HI/AVH. Patient c/o of "pain in my brain" and received PRN Tylenol 650 mg with minimal effect. Compliant with bedtime medication. Will continue to monitor patient for safety.

## 2017-03-10 NOTE — Progress Note - Problem Oriented Charting Notewrit (Addendum)
Psychiatry Progress Note  (Level 1 = Problem Focused, Level 2 = Expanded Problem Focused, Level 3 = Detailed)    Patient Name: [name]            Current Date/Time:  11/28/201810:45 AM  MRN:  [id number]                            Attending Physician: Erin Sons, MD  DOB: April 15, 1990                                Gender: male                              I. History   Informants:  patient, chart-review  A. Chief Complaint or Reason for Admission  "because I was hearing voices to hurt myself"     B.History of Present Illness: Interval History     (Symptoms and qualifiers:1 for Level 1, 2 for Level 2 and 3+ for Level 3)  26 yo M presents in acute psychosis in context of mental illness (SIMD/SIPD vs MDD vs schizoaffective), substance use (K2) and psychosocial stressors (homless, unemployed). Patient attended 1/5 groups yesterday (art) and endorsed decreased AH but depressed mood to staff.  This morning, patient states that he feels "better" in large part because he feels safe - stated that before he felt unsafe secondary to voices.  Last heard 1 unintelligible voice 1 hour ago and last saw people outside of his window yesterday.  Denies SI/HI.  States that he wasn't in mood for groups yesterday because the voices depressed him but feels better today, with voices improving every day,.  Feels ready for discharge.    Per grandmother:  Haven't seen Ishmael in a few weeks.  Is currently with [facility] but left from there and then was hospitalized.  Lives in independent living - [address], [facility] - is house where he lives with roommate(s).  In day, goes to day program where they have staff person who issues medication twice a day.  Has been at [facility] since August 1st.  Lately, last time grandmother saw him (several weeks ago), said that medication was causing tremors Emmabelle Fear grandmother wanted to set up appointment with doctor but before she could set up appointment,  patient was Derrica Sieg agitated that he left the place and then wasn't heard from for days.  Most of the time patient is quiet and isolative - paces, eats a lot, doesn't talk too well.  When patient was admitted to independent level, grandmother was told that he would be doing group sessions and some sort of employment training, but patient said that they just sit there and don't do anything, which frustrates him.  He's a smart young man.  Has to buy his own food, maintain his own room.  As a whole, will speak when spoken to.  Was never major talker but now it seems to be worse - more into self.  Very slow to respond and answer questions - seems to be medication-related (not like this prior to medications).    Has always been a little hyper, ever since he was a little boy.  Mother murdered at age 46 Shatarra Wehling has lived with grandmother and sister since then.  Father had been in jail for 12 years, from  mother's murder until patient was age 26.  Don't have any relationship - Bradrick once visited him and then came home immediately.  Had issues in school, called ADHD, was recommended Ritalin but grandmother refused.  Able to draw - talented at drawing.  As got older, had "problems" - skipped classes, got into "scrimmages", was a Education officer, environmental like many other boys" Corey Laski was sent to alternative school called [employer] in 11th grade with smaller room setting and graduated with Honors.  Lacks motivation.  As he grew up, appeared depressed and paranoid, got many tattoos which may have made employment difficult - has only been part-time employed.  Kept to himself, slept a lot, often said things that made no sense - you would talk about weather and he'd suddenly talk about dog going down the street, would switch everything up.  But was always intelligible.  Never said that anyone was out to get him but always said "things don't seem right".  A few years ago, started smoking K2 but doesn't smoke it currently but still has aftermath.   First started hearing voices after smoking K2 - has been 3 years now.  Has been in multiple hospitals since then is out in the street - has been homeless, including last winter.  Every time he goes to a different hospital, he gets a new medication.  Generally, at home, intermittently compliant. When using substances, would talk a lot "of gibberish".  No hx of mania.  Pacing likely started before K2 use in addition to hyperphagia.  Patient never disrespectful but always isolative and quiet.       To grandmother, patient "sounded okay" when they spoke over the weekend and he explained that he would return to [facility] after discharge.  As a whole, "he's not the Vitaliy that we knew."  With pacing, grandmother didn't feel comfortable with him the in the house and she would like him to be in independent living but he doesn't feel that it's helpful - no program, just sits there.  Once took medication that his whole body rejected and required ambulance - hands shaking.      Fhx of schizophrenia and unknown other conditions - great uncle.  Sister with cerebral palsy.    C.Medical History  Review of Systems  (Level 1 is none, Level 2 is 1, and Level 3 is 2+)   14 point medical review of systems: Psychiatric : depression/hall     D.Additional Past Psychiatric, Substance Use, Medical, Family and Social History  Psychiatric: No new information available as compared to previous encounter(s)  Substance Use: No new information available as compared to previous encounter(s)  Medical: No new information available as compared to previous encounter(s)  Family: No new information available as compared to previous encounter(s)  Social: No new information available as compared to previous encounter(s)    Medications:   Current Medications    Scheduled     Medication Dose/Rate, Route, Frequency Last Action    escitalopram (LEXAPRO) tablet 20 mg 20 mg, PO, Daily Given: 11/28 1029    haloperidol (HALDOL) tablet 5 mg 5 mg, PO, Q12H Given: 11/28  1029         PRN     Medication Dose/Rate, Route, Frequency Last Action    acetaminophen (TYLENOL) tablet 650 mg 650 mg, PO, Q6H PRN Ordered    alum & mag hydroxide-simethicone (MAALOX PLUS) 200-200-20 mg/5 mL suspension 30 mL 30 mL, PO, Q4H PRN Ordered    diphenhydrAMINE (BENADRYL) capsule 50  mg 50 mg, PO, Q6H PRN Ordered    diphenhydrAMINE (BENADRYL) injection 50 mg 50 mg, IM, Q6H PRN Ordered    docusate sodium (COLACE) capsule 100 mg 100 mg, PO, QD PRN Ordered    haloperidol (HALDOL) tablet 5 mg 5 mg, PO, Q6H PRN Ordered    haloperidol lactate (HALDOL) injection 5 mg 5 mg, IM, Q6H PRN Ordered    LORazepam (ATIVAN) injection 2 mg No Dose/Rate, IM, Q6H PRN See Alternative: 11/24 2111    LORazepam (ATIVAN) tablet 2 mg 2 mg, PO, Q6H PRN Given: 11/24 2111    traZODone (DESYREL) tablet 50 mg 50 mg, PO, QHS PRN Given: 11/24 2111                II. Examination   Vital signs reviewed:   Blood pressure 132/83, pulse 79, temperature 97.7 F (36.5 C), temperature source Oral, resp. rate 16, height 1.676 m (5\' 6" ), weight 99.8 kg (220 lb), SpO2 97 %.     Mental Status Exam  (Level 1 is 1-5, Level 2 is 6-8, Level 3 is 9+)  General appearance: Well-developed,  African-American male ,multiple tattoos all over his body, clean, wearing pt gown  Attitude/Behavior: cooperative, quiet, good eye contact, observed pacing behind door prior to interview  Motor: pt was observed walking; no abnormalities noted  Gait: Appeared to be normal  Muscle strength and tone: not tested   Speech:  Non-spontaneous, monotonous, delayed response - all similar to prior  Mood: okay  Affect:  Restricted - but slightly more reactive than yesterday  Thought Process:  Denies SI/HI  Associations: goal directed  Perceptions: +AH, -VH  Insight: limited  Judgment: limited  Cognition:  Unknown impairment - remains slow to respond, very concrete    Psychiatric / Cognitive Instruments: None    Pertinent Physical Exam: Physical examination is essentially  unremarkable from the previous examination    Imaging / EKG / Labs: Labs are essentially unremarkable from the previous examination    III. Assessment and Plan (Medical Decision Making)     1. I certify that this patient continues to require inpatient hospitalization due to His current condition and intoxication with psychoactive substances and also previously diagnosed psychiatric disorder    2. Psychiatric Diagnoses   Axis I    Schizophrenia, chronic 295.90   Substance induced mood disorder, psychotic disorder   Polysubstance dependence   K-2 dependency   Axis II    Deferred 799.90   Cluster B personality disorder   Axis III  History reviewed. No pertinent past medical history.  History reviewed. No pertinent surgical history.   Axis IV   Problems with primary support group   Axis V   Current GAF: 31-40: impairment in reality testing.    3.All diagnostic procedures completed since admission were reviewed. Past medical records reviewed. Coordination of care was discussed with inpatient team and as available with the outpatient team.    4. On this admission patient educated about and provided input into their treatment plan.  Patient understands potential risks and benefits of proposed treatment plan.     5.  Assessment / Impression  26 yo M presents in acute psychosis in context of mental illness (SIMD/SIPD vs MDD vs schizoaffective), substance use (K2) and psychosocial stressors (homless, unemployed).  Presentation suggestive of underlying primary psychotic disorder given increasingly bizarre behavior (cognitive impairments, pacing) prior to first break and patient's slow resolution of psychosis while inpatient.  MRI negative for acute abnormality despite hx of head  trauma.  Patient appears to be at baseline and is amenable for discharge to [facility] likely at end of week.    Suicide Risk Assessment : Denied SI    Plan / Recommendations:  Biological Plan:  Medications:   - Haldol 5mg  po bid   -lexapro 20mg   daily      Medical Work-up: EEG re-ordered by neurology, MRI negative  Consults: neurology consult    Psychosocial Plan:  Individual therapy: Psycho-education and psychotherapy of the following modalities will continue to be provided during daily visits  Group and milieu therapies: daily per unit's schedule.    Continue with Social Work intervention provided for discharge planning including assistance with .    Plan for Family Involvement: Dr. Baxter Flattery spoke with grandmother at [phone] - note that patient requests no divulgence of substance use     Other Providers Contact Information and Dates Contacted: discussed with providers at [facility]    Total Attending time spent 35 minutes (floor time) with more than 50 percent of time in direct patient contact, coordinating care and counseling.    Signed by: Buck Mam, MD  03/10/2017  10:45 AM     NP'S ATTESTATION:  The patient  was seen and evaluated by myself with resident, Dr. Baxter Flattery.  I performed the critical portion of the service (history, mental status exam and medical decision-making).     The above note of the resident has been reviewed in its entirety, and any additions or edits have been made directly into the note.  I agreed with the findings and plan of care as documented in the resident's note.   The assessment and plan were discussed with the patient and resident.     [facility] June C Ulla Mckiernan, NP  03/10/2017 / 4:34 PM

## 2017-03-10 NOTE — Progress Notes (Signed)
11/27  In bed with eyes closed, safety maintained.

## 2017-03-10 NOTE — Plan of Care (Signed)
Problem: At Risk for Harm to Self: AS EVIDENCED BY...  Goal: Attends a minimum number of therapies daily  Outcome: Progressing  Mental Health Therapy Progress Note    Patient has attended one group thus far today.  In creative therapy, patient worked on an Event organiser.  Pt was quiet.  Pt interacted appropriately with others. Will continue to monitor, assess and encourage group attendance.

## 2017-03-10 NOTE — Progress Notes (Signed)
Routine EEG completed as ordered. Results to follow.

## 2017-03-11 DIAGNOSIS — R9401 Abnormal electroencephalogram [EEG]: Secondary | ICD-10-CM | POA: Diagnosis present

## 2017-03-11 NOTE — UM Notes (Signed)
03/11/17 2:36 pm Continued stay review for admission date     Legal Status: Voluntary    Per chart notes resident MD Dr. Baxter Flattery: Patient attended 1/5 groups yesterday and reported "pain in his brain" for which he received prn pain medication.  EEG was performed, pending assessment.  Patient reports feeling "better" today and believes that medications are helping without adverse side effects.  Does not endorse current AH or remember last time he heard them.  Last VH of people outside window was at 8PM the prior night.  Reports sleep/appetite as "good."  Describes mood as "okay" and is amenable to return to [facility].  Vital signs reviewed:   03/11/17 Blood pressure 115/79, pulse 74, temperature 98.4 F (36.9 C), temperature source Oral, resp. rate 18, height 1.676 m (5\' 6" ), weight 99.8 kg (220 lb), SpO2 98 %.     Mental Status Exam  (Level 1 is 1-5, Level 2 is 6-8, Level 3 is 9+)  General appearance: Well-developed,  African-American male ,multiple tattoos all over his body, clean, wearing pt gown  Attitude/Behavior: cooperative, quiet, good eye contact, observed pacing behind door prior to interview  Motor: pt was observed walking; no abnormalities noted  Gait: Appeared to be normal  Muscle strength and tone: not tested   Speech:  Non-spontaneous, monotonous, delayed response - all similar to prior  Mood: okay  Affect:  Restricted - but slightly more reactive than yesterday  Thought Process:  Denies SI/HI  Associations: goal directed  Perceptions: denies AH/VH  Insight: limited  Judgment: limited  Cognition:  Unknown impairment - remains slow to respond, very concrete  Psychiatric Diagnoses   Axis I    Schizophrenia, chronic 295.90   Substance induced mood disorder, psychotic disorder   Polysubstance dependence   K-2 dependency   Axis II    Deferred 799.90   Cluster B personality disorder    Medications psych scheduled  Lexapro 20mg  po qd first dose 03/10/17  Haldol 5 mg po q 12 hr first  dose 03/04/17    Per Attestation by NP Dr. Heywood Iles, He also had his alst suicidal thought last om. He requires 1 more night for consolidation of gains and to assure SI is gone  03/10/17 SW verified that patient has stable housing @ [address] [place], [place] [address].    Review [employer] [phone]  Spoke to Leotis Shames approved 1 day  NRD 03/12/17 if decompenstates/not able to d/c  Iantha Fallen, RN,BSN  UR Case Manager, Psychiatry  [facility]  [phone]

## 2017-03-11 NOTE — Plan of Care (Signed)
Problem: At Risk for Harm to Self: AS EVIDENCED BY...  Goal: Reduction of self-harm thoughts and no attempts  Outcome: Progressing   03/11/17 2359   Goal/Interventions addressed this shift   Reduction of self-harm thoughts and no attempts Educate patient to ask for help when experiencing increased thoughts to harm self;Assess patient for thoughts or intent of self-harm   Patient is calm and cooperative with care. He has been isolative to his room this shift. He spent most of the evening resting in bed. Describes his mood as "okay." He denies SI/HI/AVH. He was compliant with bedtime medication. Will continue to monitor patient for safety.

## 2017-03-11 NOTE — Progress Note - Problem Oriented Charting Notewrit (Signed)
Psychiatry Progress Note  (Level 1 = Problem Focused, Level 2 = Expanded Problem Focused, Level 3 = Detailed)    Patient Name: [name]            Current Date/Time:  11/29/20182:30 PM  MRN:  [id number]                            Attending Physician: Erin Sons, MD  DOB: 1990/05/17                                Gender: male                              I. History   Informants:  patient, chart-review  A. Chief Complaint or Reason for Admission  "because I was hearing voices to hurt myself"     B.History of Present Illness: Interval History     (Symptoms and qualifiers:1 for Level 1, 2 for Level 2 and 3+ for Level 3)  26 yo M presents in acute psychosis in context of mental illness (working dx schizoaffective d/o), substance use (K2) and psychosocial stressors (unemployed). Patient attended 1/5 groups yesterday and reported "pain in his brain" for which he received prn pain medication.  EEG was performed, pending assessment.  Patient reports feeling "better" today and believes that medications are helping without adverse side effects.  Does not endorse current AH or remember last time he heard them.  Last VH of people outside window was at 8PM the prior night.  Reports sleep/appetite as "good."  Describes mood as "okay" and is amenable to return to [facility].      C.Medical History  Review of Systems  (Level 1 is none, Level 2 is 1, and Level 3 is 2+)   14 point medical review of systems: Psychiatric : depression/hall     D.Additional Past Psychiatric, Substance Use, Medical, Family and Social History  Psychiatric: No new information available as compared to previous encounter(s)  Substance Use: No new information available as compared to previous encounter(s)  Medical: No new information available as compared to previous encounter(s)  Family: No new information available as compared to previous encounter(s)  Social: No new information available as compared to previous  encounter(s)    Medications:   Current Medications    Scheduled     Medication Dose/Rate, Route, Frequency Last Action    escitalopram (LEXAPRO) tablet 20 mg 20 mg, PO, Daily Given: 11/29 0832    haloperidol (HALDOL) tablet 5 mg 5 mg, PO, Q12H Given: 11/29 0832         PRN     Medication Dose/Rate, Route, Frequency Last Action    acetaminophen (TYLENOL) tablet 650 mg 650 mg, PO, Q6H PRN Given: 11/28 2111    alum & mag hydroxide-simethicone (MAALOX PLUS) 200-200-20 mg/5 mL suspension 30 mL 30 mL, PO, Q4H PRN Ordered    diphenhydrAMINE (BENADRYL) capsule 50 mg 50 mg, PO, Q6H PRN Ordered    diphenhydrAMINE (BENADRYL) injection 50 mg 50 mg, IM, Q6H PRN Ordered    docusate sodium (COLACE) capsule 100 mg 100 mg, PO, QD PRN Ordered    haloperidol (HALDOL) tablet 5 mg 5 mg, PO, Q6H PRN Ordered    haloperidol lactate (HALDOL) injection 5 mg 5 mg, IM, Q6H PRN Ordered    LORazepam (ATIVAN)  injection 2 mg No Dose/Rate, IM, Q6H PRN See Alternative: 11/24 2111    LORazepam (ATIVAN) tablet 2 mg 2 mg, PO, Q6H PRN Given: 11/24 2111    traZODone (DESYREL) tablet 50 mg 50 mg, PO, QHS PRN Given: 11/24 2111                II. Examination   Vital signs reviewed:   Blood pressure 115/79, pulse 74, temperature 98.4 F (36.9 C), temperature source Oral, resp. rate 18, height 1.676 m (5\' 6" ), weight 99.8 kg (220 lb), SpO2 98 %.     Mental Status Exam  (Level 1 is 1-5, Level 2 is 6-8, Level 3 is 9+)  General appearance: Well-developed,  African-American male ,multiple tattoos all over his body, clean, wearing pt gown  Attitude/Behavior: cooperative, quiet, good eye contact, observed pacing behind door prior to interview  Motor: pt was observed walking; no abnormalities noted  Gait: Appeared to be normal  Muscle strength and tone: not tested   Speech:  Non-spontaneous, monotonous, delayed response - all similar to prior  Mood: okay  Affect:  Restricted - but slightly more reactive than yesterday  Thought Process:  Denies SI/HI  Associations:  goal directed  Perceptions: denies AH/VH  Insight: limited  Judgment: limited  Cognition:  Unknown impairment - remains slow to respond, very concrete    Psychiatric / Cognitive Instruments: None    Pertinent Physical Exam: Physical examination is essentially unremarkable from the previous examination    Imaging / EKG / Labs: Labs are essentially unremarkable from the previous examination    III. Assessment and Plan (Medical Decision Making)     1. I certify that this patient continues to require inpatient hospitalization due to His current condition and intoxication with psychoactive substances and also previously diagnosed psychiatric disorder    2. Psychiatric Diagnoses   Axis I    Schizophrenia, chronic 295.90   Substance induced mood disorder, psychotic disorder   Polysubstance dependence   K-2 dependency   Axis II    Deferred 799.90   Cluster B personality disorder   Axis III  History reviewed. No pertinent past medical history.  History reviewed. No pertinent surgical history.   Axis IV   Problems with primary support group   Axis V   Current GAF: 31-40: impairment in reality testing.    3.All diagnostic procedures completed since admission were reviewed. Past medical records reviewed. Coordination of care was discussed with inpatient team and as available with the outpatient team.    4. On this admission patient educated about and provided input into their treatment plan.  Patient understands potential risks and benefits of proposed treatment plan.     5.  Assessment / Impression  26 yo M presents in acute psychosis in context of mental illness (working dx schizoaffective d/o), substance use (K2) and psychosocial stressors (unemployed).  Presentation suggestive of underlying primary psychotic disorder given increasingly bizarre behavior (cognitive impairments, pacing) prior to first break and patient's slow resolution of psychosis while inpatient.  MRI negative for acute abnormality despite hx  of head trauma, EEG read pending.  Patient appears to be at baseline and is amenable for discharge to [facility] tomorrow.    Suicide Risk Assessment : Denied SI    Plan / Recommendations:  Biological Plan:  Medications:   - Haldol 5mg  po bid   -lexapro 20mg  daily      Medical Work-up: EEG re-ordered by neurology, MRI negative  Consults: neurology consult  Psychosocial Plan:  Individual therapy: Psycho-education and psychotherapy of the following modalities will continue to be provided during daily visits  Group and milieu therapies: daily per unit's schedule.    Continue with Social Work intervention provided for discharge planning including assistance with .    Plan for Family Involvement: Dr. Baxter Flattery spoke with grandmother at [phone] - note that patient requests no divulgence of substance use     Other Providers Contact Information and Dates Contacted: discussed with providers at [facility]    Total Attending time spent 35 minutes (floor time) with more than 50 percent of time in direct patient contact, coordinating care and counseling.    Signed by: Buck Mam, MD  03/11/2017  2:30 PM

## 2017-03-11 NOTE — Plan of Care (Signed)
Problem: At Risk for Harm to Self: AS EVIDENCED BY...  Goal: Attends a minimum number of therapies daily  Outcome: Not Progressing  Mental Health Therapy Progress Note    Patient has attended one group thus far today. Pt worked on an Event organiser while in creative therapy. Pt was quiet and pleasant.  Pt did not interact with peers.  Will continue to monitor, assess and encourage group attendance.

## 2017-03-11 NOTE — Plan of Care (Addendum)
Problem: At Risk for Harm to Self: AS EVIDENCED BY...  Goal: Completes discharge safety and recovery plan  Outcome: Progressing  SW met with patient and patient's mood is improving. Pt attended 1-2 group therapies. Pt is compliant with psychiatric drug treatment. The plan is for patient to discharge to home on 11/30. Pt is scheduled @ [facility] with Dr. Jerre Simon on 12/3 @ 2:30 pm. SW will contact patient's insurance, Medicaid to verify if transportation benefits are offered. SW will continue to monitor, provide clinical update to Dr. Jerre Simon and assist patient accordingly.

## 2017-03-11 NOTE — Treatment Plan (Signed)
Interdisciplinary Treatment Plan Update Meeting    03/11/2017  [name]    Participants:  Patient:  Robert Meuse   NP: Heywood Iles  RN: Lolly Mustache    Objective:  Review response to treatment, reassess needs/goals, update plan as indicated incorporating patient's strengths and stated needs, goals, and preferences.    1. Summary of Patient Progress on Treatment Plan Goals:  Patient was present at the treatment team meeting.  Patient has been visible on the unit for meals. Patien has no SI/HI/AVH at this time..patient reports his mood as, "Chipper." he denies any medication side effects and is cooperative and compliant with medication administration.Plan: no medication changes at this time. Continue to monitor.             2. Level of Patient Involvement:  Passively engaged    3. Patient Understanding of Plan of Care:  Concrete understanding of primary goal/interventions    4. Level of Agreement/Commitment to Plan of Care:  Agrees with and is committed to plan of care          Contributor Signatures:      MD_________________________________ Date___________________    SW_________________________________Date ___________________    RN _________________________________Date____________________    Other________________________________Date ___________________    (This document is signed electronically by Clinical research associate and electronic co-signer.  Other participants sign a printed copy which is scanned into the EMR)

## 2017-03-11 NOTE — Plan of Care (Signed)
Problem: At Risk for Harm to Self: AS EVIDENCED BY...  Goal: Attends a minimum number of therapies daily  Outcome: Progressing  Daily Group Attendance Note:      OrientationandGoalsGroup:AttendedNo      Creative Therapy/Art Therapy:Attended Yes      Group Therapy :Attended No      Specialty Group:Attended No      Evening Group:Attended No

## 2017-03-11 NOTE — Plan of Care (Signed)
Problem: At Risk for Harm to Self: AS EVIDENCED BY...  Goal: Patient's recovery goal in his/her own words:  Outcome: Progressing   03/11/17 1807   Goal/Interventions addressed this shift   Patient's recovery goal in his/her own words:  Assist to identify goals for treatment based on individual needs and strengths     Patient has been visible on the unit for meals. Patien has no SI/HI/AVH at this time..patient reports his mood as, "Chipper." he denies any medication side effects and is cooperative and compliant with medication administration. Patient identified a daily goal, " To go to meetings today." Will continue to monitor patient for safety.

## 2017-03-11 NOTE — Progress Notes (Signed)
Patient resting in bed with eyes closed throughout the night. Safety maintained.

## 2017-03-11 NOTE — Procedures (Signed)
There is a 7-8 Hz posteriorly dominant alpha rhythm that is fairly well modulated, and achieves amplitudes up to 45 mv. Frontally dominant beta is seen with frequencies between 20 and 30Hz and low amplitudes. There are no interhemispheric asymmetries. No spike or sharp waves are noted.  Photic stimulation produces bilateral photic driving responses. Hyperventilation was not performed. Drowsiness causes slowing of the background and more prominent theta activity. Stage II sleep is not seen.    Impression:  This is an abnormal routine EEG in the awake and drowsy states with mild generalized slowing consistent with encephalopathy. There is no focal, lateralizing or epileptiform activity. Clinical correlation is recommended.

## 2017-03-12 DIAGNOSIS — F17200 Nicotine dependence, unspecified, uncomplicated: Secondary | ICD-10-CM | POA: Diagnosis present

## 2017-03-12 DIAGNOSIS — F251 Schizoaffective disorder, depressive type: Principal | ICD-10-CM

## 2017-03-12 DIAGNOSIS — F1210 Cannabis abuse, uncomplicated: Secondary | ICD-10-CM

## 2017-03-12 MED ORDER — HALOPERIDOL 5 MG PO TABS
5.0000 mg | ORAL_TABLET | Freq: Two times a day (BID) | ORAL | 0 refills | Status: AC
Start: 2017-03-12 — End: ?

## 2017-03-12 MED ORDER — NICOTINE 21 MG/24HR TD PT24
1.0000 | MEDICATED_PATCH | Freq: Every day | TRANSDERMAL | 0 refills | Status: DC
Start: 2017-03-12 — End: 2018-08-11

## 2017-03-12 MED ORDER — ESCITALOPRAM OXALATE 10 MG PO TABS
20.0000 mg | ORAL_TABLET | Freq: Every day | ORAL | 0 refills | Status: AC
Start: 2017-03-12 — End: ?

## 2017-03-12 MED ORDER — TRAZODONE HCL 50 MG PO TABS
50.0000 mg | ORAL_TABLET | Freq: Every evening | ORAL | 0 refills | Status: AC | PRN
Start: 2017-03-12 — End: ?

## 2017-03-12 NOTE — Plan of Care (Signed)
Problem: At Risk for Harm to Self: AS EVIDENCED BY...  Goal: Attends a minimum number of therapies daily  Outcome: Not Progressing   03/12/17 1040   Goal/Interventions addressed this shift   Attends a minimum number of therapies daily  Encourage attendance and reinforce small successes in participation     Patient is isolative to room and to self. Alert and oriented x4. Denies SI/HI/AVH. Denies pain. Patient states mood is "great" and states he is "excited" about going home today. Patient is calm and cooperative with care. Med compliant. Able to make needs known. Will continue to monitor for isolation.

## 2017-03-12 NOTE — Discharge Summary -  Nursing (Signed)
03/12/2017 Kyshon received discharge order.  Status upon discharge: Patient is alert and oriented x4. Denies SI/HI/AVH. Denies pain. Patient's mood is "upbeat" and "hopeful" Patient discharged home in a taxi.    Safety plan was  completed and a copy given to patient.     After Visit Summary, AVS, including prescribed medications, were reviewed with patient and patient was able to verbalize understanding of follow-up plan.  Patient was given a copy of their AVS and Adult Wellness, Recovery & Safety Plan.    Stryder was discharged to Home   Accompanied by alone     Mode of transportation: Taxi    Personal belongings were returned).

## 2017-03-12 NOTE — Plan of Care (Addendum)
Problem: At Risk for Harm to Self: AS EVIDENCED BY...  Goal: Completes discharge safety and recovery plan  Outcome: Adequate for Discharge  Pt will be discharged to home on this date. Pt is scheduled @ [facility] with Dr. Tonie Griffith on 12/3 @ 2:30 pm. Pt will need transportation to his residence (please contact Curt Jews. for approval to transport to [place], [place]). SW will be available if additional needs arises.

## 2017-03-12 NOTE — Progress Notes (Signed)
Patient resting in bed with eyes closed for most of the night. Safety maintained.

## 2017-03-13 NOTE — Discharge Summary (Signed)
PSYCHIATRY DISCHARGE SUMMARY  AND POST DISCHARGE CONTINUING CARE PLAN    Date/Time: 03/13/2017 11:12 AM  Patient Name: [name], [name]  MRN#: [id number]  Age: 26 y.o.   Date of Birth: 01-28-1991    Date of Admission:   02/26/2017  Date of Discharge:     03/12/2017  Admitting Physician:    Willeen Cass, MD  Discharge Physician:    Erin Sons MD    Event leading to hospitalization / Reason for Hospitalization   Chief Complaint or Reason for Admission     Legal Status on admission was voluntary.    Discharge Diagnoses:       Principal Discharge Diagnosis:schizoaffective DO depressed multiple admission acute exacerbation level 2          Other  Psychiatric Diagnoses Axis I    Tobacco use do   Polysubstance dependence   K-2 dependency   Axis II    Deferred 799.90   Cluster B personality disorder   Axis III     Past Medical History   History reviewed. No pertinent past medical history.        Past Surgical History   History reviewed. No pertinent surgical history.      Axis IV   Problems with primary support group   Axis V   admit GAF: 325               GAF at Discharge:41-50: serious symptoms.    Comorbid Conditions: Adverse medication side effect, Cannabis use Disorder - current or past, Medication noncompliance, Suicidal behavior -current or past and Tobacco use disorder- current or past    Labs/Scans/EKG     Results     Procedure Component Value Units Date/Time    Basic Metabolic Panel [[id number]] Collected:  03/02/17 0607    Specimen:  Blood Updated:  03/02/17 0814     Glucose 85 mg/dL      BUN 47.8 mg/dL      Creatinine 1.0 mg/dL      Calcium 9.3 mg/dL      Sodium 295 mEq/L      Potassium 4.2 mEq/L      Chloride 107 mEq/L      CO2 23 mEq/L     Hemolysis index [[id number]]  (Abnormal) Collected:  03/02/17 0607     Updated:  03/02/17 0814     Hemolysis Index 37 (H)    GFR [[id number]] Collected:  03/02/17 0607     Updated:  03/02/17 0814     EGFR >60.0    Hemoglobin A1C [[id number]] Collected:   03/02/17 0607    Specimen:  Blood Updated:  03/02/17 0811     Hemoglobin A1C 5.6 %      Average Estimated Glucose 114.0 mg/dL     Lipid panel [[id number]]  (Abnormal) Collected:  02/27/17 0950    Specimen:  Blood Updated:  02/27/17 1144     Cholesterol 173 mg/dL      Triglycerides 71 mg/dL      HDL 35 (L) mg/dL      LDL Calculated 403 (H) mg/dL      VLDL Cholesterol Cal 14 mg/dL      CHOL/HDL Ratio 4.9    Hemolysis index [[id number]] Collected:  02/27/17 0950     Updated:  02/27/17 1144     Hemolysis Index 9    GFR [[id number]] Collected:  02/26/17 1557     Updated:  02/26/17 1637     EGFR >60.0    Comprehensive metabolic panel (  CMP) [[id number]]  (Abnormal) Collected:  02/26/17 1557    Specimen:  Blood Updated:  02/26/17 1637     Glucose 92 mg/dL      BUN 7.0 (L) mg/dL      Creatinine 1.1 mg/dL      Sodium 409 mEq/L      Potassium 4.1 mEq/L      Chloride 103 mEq/L      CO2 27 mEq/L      Calcium 9.6 mg/dL      Protein, Total 7.6 g/dL      Albumin 4.3 g/dL      AST (SGOT) 27 U/L      ALT 25 U/L      Alkaline Phosphatase 82 U/L      Bilirubin, Total 0.6 mg/dL      Globulin 3.3 g/dL      Albumin/Globulin Ratio 1.3    Alcohol (Ethanol)  Level [[id number]] Collected:  02/26/17 1557    Specimen:  Blood Updated:  02/26/17 1637     Alcohol None Detected mg/dL     Acetaminophen level [[id number]]  (Abnormal) Collected:  02/26/17 1557    Specimen:  Blood Updated:  02/26/17 1637     Acetaminophen Level <7 (L) ug/mL     ASA  level [[id number]]  (Abnormal) Collected:  02/26/17 1557    Specimen:  Blood Updated:  02/26/17 1637     Salicylate Level <5.0 (L) mg/dL     Urine Rapid Drug Screen [[id number]] Collected:  02/26/17 1606    Specimen:  Urine Updated:  02/26/17 1635     Amphetamine Screen, UR Negative     Barbiturate Screen, UR Negative     Benzodiazepine Screen, UR Negative     Cannabinoid Screen, UR Negative     Cocaine, UR Negative     Opiate Screen, UR Negative     PCP Screen, UR Negative    CBC with differential [[id number]]  Collected:  02/26/17 1557    Specimen:  Blood from Blood Updated:  02/26/17 1620     WBC 5.66 x10 3/uL      Hgb 14.5 g/dL      Hematocrit 64.4 %      Platelets 189 x10 3/uL      RBC 5.02 x10 6/uL      MCV 88.0 fL      MCH 28.9 pg      MCHC 32.8 g/dL      RDW 13 %      MPV 9.5 fL      Neutrophils 55.7 %      Lymphocytes Automated 35.9 %      Monocytes 7.1 %      Eosinophils Automated 0.4 %      Basophils Automated 0.5 %      Immature Granulocyte 0.4 %      Nucleated RBC 0.0 /100 WBC      Neutrophils Absolute 3.16 x10 3/uL      Abs Lymph Automated 2.03 x10 3/uL      Abs Mono Automated 0.40 x10 3/uL      Abs Eos Automated 0.02 x10 3/uL      Absolute Baso Automated 0.03 x10 3/uL      Absolute Immature Granulocyte 0.02 x10 3/uL      Absolute NRBC 0.00 x10 3/uL         No results found for any visits on 02/26/17.  Cardiology Results     Procedure Component Value Units Date/Time    ECG 12 lead [  [id number]] Collected:  02/26/17 1623     Updated:  02/27/17 2227     Ventricular Rate 61 BPM      Atrial Rate 61 BPM      P-R Interval 162 ms      QRS Duration 90 ms      Q-T Interval 398 ms      QTC Calculation (Bezet) 400 ms      P Axis 43 degrees      R Axis 51 degrees      T Axis 1 degrees     Narrative:       NORMAL SINUS RHYTHM  ST ELEVATION, CONSIDER EARLY REPOLARIZATION  BORDERLINE NORMAL/ABNORMAL ELECTROCARDIOGRAM  WHEN COMPARED WITH ECG OF 31-Mar-2016 12:39,  NO SIGNIFICANT CHANGE WAS FOUND  Confirmed by Rayburn Go MD, PAMELA ([id number]) on 02/27/2017 10:27:07 PM    EKG SCAN [[id number]] Resulted:  02/27/17 2227     Updated:  02/27/17 2227          Consults:     Consult Orders     None          Discharge Day Evaluation     Blood pressure 109/70, pulse 70, temperature 98.2 F (36.8 C), temperature source Oral, resp. rate 18, height 1.676 m (5\' 6" ), weight 99.8 kg (220 lb), SpO2 95 %.    Mental Status Exam    General appearance: Appears chronological age, Poor hygiene and Poor grooming  Attitude/Behavior: Calm and  Cooperative  Motor: No abnormalities noted  Gait: No obvious abnormalities  Muscle strength and tone: Grossly intact  Speech:   Spontaneous: No  Rate and Rhythm: Normal  Volume: Soft  Tone: Normal  Clarity: Yes  Mood: better  Affect:   Range: Constricted  Stability: Stable  Appropriateness to thought content: Yes  Intensity: Dull  Thought Process:   Coherent: Yes  Logical:  Yes  Associations: Goal-directed  Thought Content:   Delusions:  Not elicited  Depressive Cognitions:  None  Suicidal:  No suicidal thoughts  Homicidal:  No homicidal thoughts  Violent Thoughts:  No  Perceptions:   Dissociative Phenomena:  No  Hallucinations: No  Insight: Fair  Judgment: Good  Cognition:       Level of Consciousness: Intact  Orientation: Intact to self, place and time  Recent Memory: Intact  Remote Memory: Intact  Attention and Concentration: Intact  Fund of Knowledge: Good    Review of Systems  A complete 14 point ROS was done and was negative except for  Psychiatric: Anxiety related to [facility] Course:   The pt was admiited with SI and CAH and VH . He was very disorganized. This is the context of schizoaffective with K2 use.Marland Kitchen He was started on risperdal as previous chart. On collateral it was determined from Dr Judd Lien that the pt was haldol 5 bid and haldol dec .( last shot 111/20.) risperdal dcd He was also on lexapro. this regimen was restarted due to non compliance. Collateral obtained from grandmother. Slowly pt began to improve. The voices quieted and he had were less frequent and at [facility] he was with no voices or visions  For 30 hours. With the resolution of this his si ended. He was in contact with grandmother which made him hopeful. He tolerated the meds well and from collateral seemed back to baseline. On 11/30 he requested [place] and he as without si HI and his mood was better. He had good insight into reason for hallucinations and mood issues.  Long discussion around [place] and staying away from substances and pt agreed.  Hew as dcd in stable and improved condition.      Suicidal and Homicidal Status on Discharge:   Patient denies suicidal and homicidal ideation, intent and plan.    Discharge Instructions:   Discharge Disposition: Home - Self Care    Discharge Instructions given to: Patient    Questions that may arise between hospital discharge and your first follow-up appointment should be directed to [facility]: [phone]; 911 or the closest emergency department    Case discussion was held between inpatient treatment team and outpatient provider(s).  Discharge Plan:       Attestation:       Patient discharged on more than 1 antipsychotic medication? No  On discharge, was the patient on any psychotropic medication off label? No  I spent at least 35 minutes coordinating the discharge and reviewing the discharge plan.    Discharge Medications:     Tobacco Cessation Discharge Prescription for Cessation Medication  Patient was assessed on admission and found to be a tobacco user.  He was offered and accepted tobacco cessation medication prescription for Nicotine 21mg /24hr patch upon discharge.       Discharge Medication List      Taking    escitalopram 10 MG tablet  Dose:  20 mg  Commonly known as:  LEXAPRO  Take 2 tablets (20 mg total) by mouth daily.     haloperidol 5 MG tablet  Dose:  5 mg  Commonly known as:  HALDOL  Take 1 tablet (5 mg total) by mouth every 12 (twelve) hours.     nicotine 21 MG/24HR  Dose:  1 patch  Commonly known as:  NICODERM CQ  Place 1 patch onto the skin daily.     traZODone 50 MG tablet  Dose:  50 mg  Commonly known as:  DESYREL  Take 1 tablet (50 mg total) by mouth nightly as needed for Sleep.        STOP taking these medications    clonazePAM 0.5 MG tablet  Commonly known as:  KlonoPIN     DULoxetine HCl 40 MG Cpep     ibuprofen 600 MG tablet  Commonly known as:  ADVIL,MOTRIN     nicotine polacrilex 2 MG gum  Commonly known as:  NICORETTE     risperiDONE 4 MG  disintegrating tablet  Commonly known as:  RisperDAL-M          Agree with disr    Signed by: Rayvon Char, NP   03/13/2017  11:12 AM

## 2018-06-25 ENCOUNTER — Emergency Department (HOSPITAL_COMMUNITY): Payer: Self-pay

## 2018-06-25 ENCOUNTER — Emergency Department (HOSPITAL_COMMUNITY)
Admission: EM | Admit: 2018-06-25 | Discharge: 2018-06-25 | Disposition: A | Discharge status: Home / Self Care | Payer: Self-pay | Attending: Emergency Medicine | Admitting: Emergency Medicine

## 2018-06-25 ENCOUNTER — Encounter (HOSPITAL_COMMUNITY): Payer: Self-pay | Admitting: *Deleted

## 2018-06-25 ENCOUNTER — Other Ambulatory Visit: Payer: Self-pay

## 2018-06-25 DIAGNOSIS — R1032 Left lower quadrant pain: Secondary | ICD-10-CM

## 2018-06-25 DIAGNOSIS — K6289 Other specified diseases of anus and rectum: Secondary | ICD-10-CM | POA: Insufficient documentation

## 2018-06-25 LAB — CBC
HCT: 43.4 % (ref 39.0–52.0)
Hemoglobin: 13.9 g/dL (ref 13.0–17.0)
MCH: 28.9 pg (ref 26.0–34.0)
MCHC: 32 g/dL (ref 30.0–36.0)
MCV: 90.2 fL (ref 80.0–100.0)
Platelets: 262 10*3/uL (ref 150–400)
RBC: 4.81 MIL/uL (ref 4.22–5.81)
RDW: 12.2 % (ref 11.5–15.5)
WBC: 7.3 10*3/uL (ref 4.0–10.5)
nRBC: 0 % (ref 0.0–0.2)

## 2018-06-25 LAB — COMPREHENSIVE METABOLIC PANEL
ALT: 27 U/L (ref 0–44)
AST: 40 U/L (ref 15–41)
Albumin: 4.4 g/dL (ref 3.5–5.0)
Alkaline Phosphatase: 68 U/L (ref 38–126)
Anion gap: 8 (ref 5–15)
BUN: 13 mg/dL (ref 6–20)
CALCIUM: 9.4 mg/dL (ref 8.9–10.3)
CO2: 27 mmol/L (ref 22–32)
CREATININE: 0.82 mg/dL (ref 0.61–1.24)
Chloride: 105 mmol/L (ref 98–111)
GFR calc Af Amer: >60 mL/min (ref >60)
GFR calc non Af Amer: >60 mL/min (ref >60)
Glucose, Bld: 107 mg/dL — ABNORMAL HIGH (ref 70–99)
Potassium: 3.6 mmol/L (ref 3.5–5.1)
Sodium: 140 mmol/L (ref 135–145)
Total Bilirubin: 0.8 mg/dL (ref 0.3–1.2)
Total Protein: 7.7 g/dL (ref 6.5–8.1)

## 2018-06-25 LAB — URINALYSIS, ROUTINE W REFLEX MICROSCOPIC
Bilirubin Urine: NEGATIVE
Glucose, UA: NEGATIVE mg/dL
Hgb urine dipstick: NEGATIVE
KETONES UR: 5 mg/dL — AB
Leukocytes,Ua: NEGATIVE
Nitrite: NEGATIVE
Protein, ur: NEGATIVE mg/dL
Specific Gravity, Urine: 1.03 (ref 1.005–1.030)
pH: 5 (ref 5.0–8.0)

## 2018-06-25 LAB — LIPASE, BLOOD: Lipase: 37 U/L (ref 11–51)

## 2018-06-25 MED ORDER — SODIUM CHLORIDE 0.9% FLUSH: 3.0000 mL | Freq: Once | INTRAVENOUS | Status: DC

## 2018-06-25 MED ORDER — IOPAMIDOL (ISOVUE-300) INJECTION 61%
100.0000 mL | Freq: Once | INTRAVENOUS | Status: AC | PRN
  Administered 2018-06-25: 100 mL via INTRAVENOUS

## 2018-06-25 MED ORDER — IOPAMIDOL (ISOVUE-300) INJECTION 61%
INTRAVENOUS | Status: AC
  Filled 2018-06-25: qty 100

## 2018-06-25 MED ORDER — DICYCLOMINE HCL 20 MG PO TABS: 20.0000 mg | ORAL_TABLET | Freq: Two times a day (BID) | ORAL | 0 refills | Status: DC

## 2018-06-25 MED ORDER — SODIUM CHLORIDE (PF) 0.9 % IJ SOLN
INTRAMUSCULAR | Status: AC
  Filled 2018-06-25: qty 50

## 2018-06-25 NOTE — ED Notes (Signed)
Urine culture sent to the lab. 

## 2018-06-25 NOTE — ED Triage Notes (Signed)
Per EMS, pt complains of LLQ and rectal pain x 2 hours. Pt states he has burning in stomach after eating. Pt denies diarrhea, n/v.

## 2018-06-26 NOTE — ED Provider Notes (Signed)
[facility]-EMERGENCY DEPT Provider Note   CSN: [id number] Arrival date & time: 06/25/18  1748    History   Chief Complaint Chief Complaint  Patient presents with  . Abdominal Pain    HPI Austin Romero is a 28 y.o. male.     HPI   28yo male presents with concern for rectal pain and LLQ pain.  Symptoms began today. Severe rectal pain "like razor blades" and pain radiating into LLQ. No urinary symptoms. No rectal bleeding. No diarrhea, nausea, or vomiting.  No fevers. Denies rectal trauma or FB.   History reviewed. No pertinent past medical history.  There are no active problems to display for this patient.   History reviewed. No pertinent surgical history.      Home Medications    Prior to Admission medications   Medication Sig Start Date End Date Taking? Authorizing Provider  dicyclomine (BENTYL) 20 MG tablet Take 1 tablet (20 mg total) by mouth 2 (two) times daily. 06/25/18   [name], MD    Family History No family history on file.  Social History Social History   Tobacco Use  . Smoking status: Never Smoker  . Smokeless tobacco: Never Used  Substance Use Topics  . Alcohol use: Not Currently  . Drug use: Not on file     Allergies   Patient has no allergy information on record.   Review of Systems Review of Systems  Constitutional: Negative for fever.  Eyes: Negative for visual disturbance.  Respiratory: Negative for shortness of breath.   Cardiovascular: Negative for chest pain.  Gastrointestinal: Positive for abdominal pain and rectal pain. Negative for blood in stool, nausea and vomiting.  Genitourinary: Negative for difficulty urinating and dysuria.  Musculoskeletal: Negative for back pain.  Skin: Negative for rash.  Neurological: Negative for syncope and headaches.     Physical Exam Updated Vital Signs BP 125/80   Pulse 70   Temp 97.8 F (36.6 C) (Oral)   Resp 16   SpO2 99%   Physical Exam Vitals  signs and nursing note reviewed.  Constitutional:      General: He is not in acute distress.    Appearance: He is well-developed. He is not diaphoretic.  HENT:     Head: Normocephalic and atraumatic.  Eyes:     Conjunctiva/sclera: Conjunctivae normal.  Neck:     Musculoskeletal: Normal range of motion.  Cardiovascular:     Rate and Rhythm: Normal rate and regular rhythm.     Heart sounds: Normal heart sounds. No murmur. No friction rub. No gallop.   Pulmonary:     Effort: Pulmonary effort is normal. No respiratory distress.     Breath sounds: Normal breath sounds. No wheezing or rales.  Abdominal:     General: There is no distension.     Palpations: Abdomen is soft.     Tenderness: There is abdominal tenderness in the left lower quadrant. There is no guarding.  Genitourinary:    Prostate: Tender: reports rectal pain, not clearly prostate tenderness.     Rectum: Tenderness present. No external hemorrhoid.  Skin:    General: Skin is warm and dry.  Neurological:     Mental Status: He is alert and oriented to person, place, and time.      ED Treatments / Results  Labs (all labs ordered are listed, but only abnormal results are displayed) Labs Reviewed  COMPREHENSIVE METABOLIC PANEL - Abnormal; Notable for the following components:      Result  Value   Glucose, Bld 107 (*)    All other components within normal limits  URINALYSIS, ROUTINE W REFLEX MICROSCOPIC - Abnormal; Notable for the following components:   APPearance HAZY (*)    Ketones, ur 5 (*)    All other components within normal limits  LIPASE, BLOOD  CBC  GC/CHLAMYDIA PROBE AMP ([place]) NOT AT [facility]    EKG None  Radiology Ct Abdomen Pelvis W Contrast  Result Date: 06/25/2018 CLINICAL DATA:  Lower abdominal pain. EXAM: CT ABDOMEN AND PELVIS WITH CONTRAST TECHNIQUE: Multidetector CT imaging of the abdomen and pelvis was performed using the standard protocol following bolus administration of intravenous  contrast. CONTRAST:  ISOVUE-300 IOPAMIDOL (ISOVUE-300) INJECTION 61% COMPARISON:  None. FINDINGS: Lower chest: Lung bases are clear. Hepatobiliary: No focal liver abnormality is seen. No gallstones, gallbladder wall thickening, or biliary dilatation. Pancreas: Unremarkable. No pancreatic ductal dilatation or surrounding inflammatory changes. Spleen: Normal in size without focal abnormality. Adrenals/Urinary Tract: Adrenal glands are unremarkable. Kidneys are normal, without renal calculi, focal lesion, or hydronephrosis. Bladder is unremarkable. Stomach/Bowel: Stomach, small bowel, and colon are not abnormally distended. Stool throughout the colon. No inflammatory changes are appreciated. Appendix is not identified. Vascular/Lymphatic: No significant vascular findings are present. No enlarged abdominal or pelvic lymph nodes. Reproductive: Prostate is unremarkable. Other: No abdominal wall hernia or abnormality. No abdominopelvic ascites. Musculoskeletal: No acute or significant osseous findings. IMPRESSION: No acute process demonstrated in the abdomen or pelvis. No evidence of bowel obstruction or inflammation. Electronically Signed   By: Burman Nieves M.D.   On: 06/25/2018 22:03    Procedures Procedures (including critical care time)  Medications Ordered in ED Medications  iopamidol (ISOVUE-300) 61 % injection 100 mL (100 mLs Intravenous Contrast Given 06/25/18 2147)     Initial Impression / Assessment and Plan / ED Course  I have reviewed the triage vital signs and the nursing notes.  Pertinent labs & imaging results that were available during my care of the patient were reviewed by me and considered in my medical decision making (see chart for details).        28yo male presents with LLQ and rectal pain. CT shows no diverticulitis, no perirectal abscess or other abnormalities.  No urinary symptoms, doubt prostatitis.  Suspect likely anal fissure given rectal pain. Do not see signs of  hemorrhoids. Recommend sitz baths, avoiding constipation and PCP follow up.   Final Clinical Impressions(s) / ED Diagnoses   Final diagnoses:  Rectal pain  Left lower quadrant abdominal pain    ED Discharge Orders         Ordered    dicyclomine (BENTYL) 20 MG tablet  2 times daily     06/25/18 2246           [name], MD 06/26/18 1122

## 2018-07-04 ENCOUNTER — Emergency Department (HOSPITAL_COMMUNITY): Payer: Self-pay

## 2018-07-04 ENCOUNTER — Other Ambulatory Visit: Payer: Self-pay

## 2018-07-04 ENCOUNTER — Emergency Department (HOSPITAL_COMMUNITY)
Admission: EM | Admit: 2018-07-04 | Discharge: 2018-07-05 | Disposition: A | Discharge status: Other Acute Inpatient Hospital | Payer: Self-pay | Attending: Emergency Medicine | Admitting: Emergency Medicine

## 2018-07-04 ENCOUNTER — Encounter (HOSPITAL_COMMUNITY): Payer: Self-pay

## 2018-07-04 ENCOUNTER — Emergency Department (HOSPITAL_COMMUNITY)
Admission: EM | Admit: 2018-07-04 | Discharge: 2018-07-04 | Disposition: A | Discharge status: Home / Self Care | Payer: Self-pay | Attending: Emergency Medicine | Admitting: Emergency Medicine

## 2018-07-04 ENCOUNTER — Encounter (HOSPITAL_COMMUNITY): Payer: Self-pay | Admitting: Emergency Medicine

## 2018-07-04 DIAGNOSIS — R0789 Other chest pain: Secondary | ICD-10-CM | POA: Insufficient documentation

## 2018-07-04 DIAGNOSIS — Z87891 Personal history of nicotine dependence: Secondary | ICD-10-CM | POA: Insufficient documentation

## 2018-07-04 DIAGNOSIS — R440 Auditory hallucinations: Secondary | ICD-10-CM | POA: Insufficient documentation

## 2018-07-04 DIAGNOSIS — R51 Headache: Secondary | ICD-10-CM | POA: Insufficient documentation

## 2018-07-04 DIAGNOSIS — F22 Delusional disorders: Secondary | ICD-10-CM | POA: Insufficient documentation

## 2018-07-04 DIAGNOSIS — R109 Unspecified abdominal pain: Secondary | ICD-10-CM | POA: Insufficient documentation

## 2018-07-04 DIAGNOSIS — R45851 Suicidal ideations: Secondary | ICD-10-CM | POA: Insufficient documentation

## 2018-07-04 DIAGNOSIS — R079 Chest pain, unspecified: Secondary | ICD-10-CM

## 2018-07-04 DIAGNOSIS — Z046 Encounter for general psychiatric examination, requested by authority: Secondary | ICD-10-CM | POA: Insufficient documentation

## 2018-07-04 LAB — COMPREHENSIVE METABOLIC PANEL
ALT: 26 U/L (ref 0–44)
ALT: 26 U/L (ref 0–44)
ANION GAP: 10 (ref 5–15)
AST: 33 U/L (ref 15–41)
AST: 33 U/L (ref 15–41)
Albumin: 3.6 g/dL (ref 3.5–5.0)
Albumin: 3.8 g/dL (ref 3.5–5.0)
Alkaline Phosphatase: 59 U/L (ref 38–126)
Alkaline Phosphatase: 60 U/L (ref 38–126)
Anion gap: 7 (ref 5–15)
BUN: 9 mg/dL (ref 6–20)
BUN: 14 mg/dL (ref 6–20)
CO2: 29 mmol/L (ref 22–32)
CO2: 25 mmol/L (ref 22–32)
CREATININE: 0.84 mg/dL (ref 0.61–1.24)
Calcium: 9.1 mg/dL (ref 8.9–10.3)
Calcium: 9.2 mg/dL (ref 8.9–10.3)
Chloride: 101 mmol/L (ref 98–111)
Chloride: 103 mmol/L (ref 98–111)
Creatinine, Ser: 0.85 mg/dL (ref 0.61–1.24)
GFR calc Af Amer: >60 mL/min (ref >60)
GFR calc Af Amer: >60 mL/min (ref >60)
GFR calc non Af Amer: >60 mL/min (ref >60)
GFR calc non Af Amer: >60 mL/min (ref >60)
Glucose, Bld: 81 mg/dL (ref 70–99)
Glucose, Bld: 105 mg/dL — ABNORMAL HIGH (ref 70–99)
POTASSIUM: 3.7 mmol/L (ref 3.5–5.1)
Potassium: 3.8 mmol/L (ref 3.5–5.1)
Sodium: 137 mmol/L (ref 135–145)
Sodium: 138 mmol/L (ref 135–145)
Total Bilirubin: 0.4 mg/dL (ref 0.3–1.2)
Total Bilirubin: 0.8 mg/dL (ref 0.3–1.2)
Total Protein: 6.5 g/dL (ref 6.5–8.1)
Total Protein: 6.7 g/dL (ref 6.5–8.1)

## 2018-07-04 LAB — CBC WITH DIFFERENTIAL/PLATELET
ABS IMMATURE GRANULOCYTES: 0.01 10*3/uL (ref 0.00–0.07)
BASOS PCT: 0 %
Basophils Absolute: 0 10*3/uL (ref 0.0–0.1)
EOS ABS: 0.1 10*3/uL (ref 0.0–0.5)
Eosinophils Relative: 2 %
HCT: 41.2 % (ref 39.0–52.0)
Hemoglobin: 13.4 g/dL (ref 13.0–17.0)
IMMATURE GRANULOCYTES: 0 %
Lymphocytes Relative: 41 %
Lymphs Abs: 2.1 10*3/uL (ref 0.7–4.0)
MCH: 29.2 pg (ref 26.0–34.0)
MCHC: 32.5 g/dL (ref 30.0–36.0)
MCV: 89.8 fL (ref 80.0–100.0)
Monocytes Absolute: 0.4 10*3/uL (ref 0.1–1.0)
Monocytes Relative: 8 %
NEUTROS PCT: 49 %
Neutro Abs: 2.5 10*3/uL (ref 1.7–7.7)
PLATELETS: 244 10*3/uL (ref 150–400)
RBC: 4.59 MIL/uL (ref 4.22–5.81)
RDW: 12 % (ref 11.5–15.5)
WBC: 5.2 10*3/uL (ref 4.0–10.5)
nRBC: 0 % (ref 0.0–0.2)

## 2018-07-04 LAB — CBC
HCT: 41.2 % (ref 39.0–52.0)
Hemoglobin: 13.3 g/dL (ref 13.0–17.0)
MCH: 28.7 pg (ref 26.0–34.0)
MCHC: 32.3 g/dL (ref 30.0–36.0)
MCV: 89 fL (ref 80.0–100.0)
NRBC: 0 % (ref 0.0–0.2)
Platelets: 269 10*3/uL (ref 150–400)
RBC: 4.63 MIL/uL (ref 4.22–5.81)
RDW: 12 % (ref 11.5–15.5)
WBC: 5.5 10*3/uL (ref 4.0–10.5)

## 2018-07-04 LAB — RAPID URINE DRUG SCREEN, HOSP PERFORMED
Amphetamines: NOT DETECTED
BENZODIAZEPINES: NOT DETECTED
Barbiturates: NOT DETECTED
Cocaine: NOT DETECTED
Opiates: NOT DETECTED
Tetrahydrocannabinol: NOT DETECTED

## 2018-07-04 LAB — I-STAT TROPONIN, ED: Troponin i, poc: 0 ng/mL (ref 0.00–0.08)

## 2018-07-04 LAB — ETHANOL: Alcohol, Ethyl (B): <10 mg/dL (ref <10)

## 2018-07-04 LAB — LIPASE, BLOOD: Lipase: 30 U/L (ref 11–51)

## 2018-07-04 MED ORDER — RANITIDINE HCL 150 MG PO TABS: 150.0000 mg | ORAL_TABLET | Freq: Two times a day (BID) | ORAL | 0 refills | Status: DC

## 2018-07-04 MED ORDER — ALUM & MAG HYDROXIDE-SIMETH 200-200-20 MG/5ML PO SUSP
30.0000 mL | Freq: Once | ORAL | Status: AC
  Administered 2018-07-04: 30 mL via ORAL
  Filled 2018-07-04: qty 30

## 2018-07-04 MED ORDER — LIDOCAINE VISCOUS HCL 2 % MT SOLN
15.0000 mL | Freq: Once | OROMUCOSAL | Status: AC
  Administered 2018-07-04: 15 mL via ORAL
  Filled 2018-07-04: qty 15

## 2018-07-04 NOTE — ED Provider Notes (Signed)
[name] EMERGENCY DEPARTMENT Provider Note   CSN: [id number] Arrival date & time: 07/04/18  0001    History   Chief Complaint Chief Complaint  Patient presents with  . Chest Pain    HPI Austin Romero is a 28 y.o. male with a hx of no major medical problems presents to the Emergency Department complaining of gradual, intermittent central chest pain onset 4 hours ago. Pt reports the pain is throbbing and rated at a 7/10.  Pt reports he used to vape, but stopped 4 mos ago.  Pt reports marijuana usage but no cocaine.  Pt reports drinking 1-2 times per week, denies any tonight. Pt denies aggravating or alleviating factors.  He has had chest pain in the past with wheezing when he is around people who are smoking.  Pt reports he thinks his immunity is low, but has never been diagnosed with this.  He denies wheezing tonight.  Pt reports he came to the ER tonight because he was worried.  He denies fever, chills, headache, neck pain, SOB, abd pain, N/V/D, weakness, dizziness, syncope, dysuria.  Pt denies cough, sick contacts.  He reports he came from [place] today with some friends.     The history is provided by the patient and medical records. No language interpreter was used.    History reviewed. No pertinent past medical history.  There are no active problems to display for this patient.   History reviewed. No pertinent surgical history.      Home Medications    Prior to Admission medications   Medication Sig Start Date End Date Taking? Authorizing Provider  dicyclomine (BENTYL) 20 MG tablet Take 1 tablet (20 mg total) by mouth 2 (two) times daily. Patient not taking: Reported on 07/04/2018 06/25/18   [name], MD  ranitidine (ZANTAC) 150 MG tablet Take 1 tablet (150 mg total) by mouth 2 (two) times daily. 07/04/18   Austin Romero, Dahlia Client, PA-C    Family History History reviewed. No pertinent family history.  Social History Social History    Tobacco Use  . Smoking status: Former Games developer  . Smokeless tobacco: Never Used  Substance Use Topics  . Alcohol use: Not Currently    Alcohol/week: 2.0 standard drinks    Types: 2 Cans of beer per week  . Drug use: Not Currently     Allergies   Patient has no known allergies.   Review of Systems Review of Systems  Constitutional: Negative for appetite change, diaphoresis, fatigue, fever and unexpected weight change.  HENT: Negative for mouth sores.   Eyes: Negative for visual disturbance.  Respiratory: Negative for cough, chest tightness, shortness of breath and wheezing.   Cardiovascular: Positive for chest pain.  Gastrointestinal: Negative for abdominal pain, constipation, diarrhea, nausea and vomiting.  Endocrine: Negative for polydipsia, polyphagia and polyuria.  Genitourinary: Negative for dysuria, frequency, hematuria and urgency.  Musculoskeletal: Negative for back pain and neck stiffness.  Skin: Negative for rash.  Allergic/Immunologic: Negative for immunocompromised state.  Neurological: Negative for syncope, light-headedness and headaches.  Hematological: Does not bruise/bleed easily.  Psychiatric/Behavioral: Negative for sleep disturbance. The patient is not nervous/anxious.      Physical Exam Updated Vital Signs BP 114/72 (BP Location: Right Arm)   Pulse 63   Temp (!) 97.5 F (36.4 C) (Oral)   Resp 12   Ht 5\' 6"  (1.676 m)   Wt 70.8 kg   SpO2 100%   BMI 25.18 kg/m   Physical Exam Vitals signs and nursing note  reviewed.  Constitutional:      General: He is not in acute distress.    Appearance: He is well-developed. He is not diaphoretic.     Comments: Awake, alert, nontoxic appearance  HENT:     Head: Normocephalic and atraumatic.     Mouth/Throat:     Pharynx: No oropharyngeal exudate.  Eyes:     General: No scleral icterus.    Conjunctiva/sclera: Conjunctivae normal.  Neck:     Musculoskeletal: Normal range of motion and neck supple.   Cardiovascular:     Rate and Rhythm: Normal rate and regular rhythm.  Pulmonary:     Effort: Pulmonary effort is normal. No respiratory distress.     Breath sounds: Normal breath sounds. No wheezing.  Abdominal:     General: Bowel sounds are normal.     Palpations: Abdomen is soft. There is no mass.     Tenderness: There is no abdominal tenderness. There is no guarding or rebound.  Musculoskeletal: Normal range of motion.  Skin:    General: Skin is warm and dry.  Neurological:     Mental Status: He is alert.     Comments: Speech is clear and goal oriented Moves extremities without ataxia      ED Treatments / Results  Labs (all labs ordered are listed, but only abnormal results are displayed) Labs Reviewed  CBC WITH DIFFERENTIAL/PLATELET  LIPASE, BLOOD  COMPREHENSIVE METABOLIC PANEL  URINALYSIS, ROUTINE W REFLEX MICROSCOPIC  RAPID URINE DRUG SCREEN, HOSP PERFORMED  I-STAT TROPONIN, ED    EKG EKG Interpretation  Date/Time:  Monday July 04 2018 00:20:39 EDT Ventricular Rate:  65 PR Interval:    QRS Duration: 92 QT Interval:  391 QTC Calculation: 407 R Axis:   87 Text Interpretation:  Sinus rhythm ST elevation suggests acute pericarditis Confirmed by Geoffery Lyons ([id number]) on 07/04/2018 12:38:04 AM   Radiology Dg Chest Port 1 View  Result Date: 07/04/2018 CLINICAL DATA:  Chest pain. EXAM: PORTABLE CHEST 1 VIEW COMPARISON:  Radiograph 06/24/2018 FINDINGS: Low lung volumes.The cardiomediastinal contours are normal. The lungs are clear. Pulmonary vasculature is normal. No consolidation, pleural effusion, or pneumothorax. No acute osseous abnormalities are seen. IMPRESSION: Low lung volumes without acute abnormality. Electronically Signed   By: Narda Rutherford M.D.   On: 07/04/2018 01:00    Procedures Procedures (including critical care time)  Medications Ordered in ED Medications  alum & mag hydroxide-simeth (MAALOX/MYLANTA) 200-200-20 MG/5ML suspension 30 mL (30 mLs  Oral Given 07/04/18 0229)    And  lidocaine (XYLOCAINE) 2 % viscous mouth solution 15 mL (15 mLs Oral Given 07/04/18 0229)     Initial Impression / Assessment and Plan / ED Course  I have reviewed the triage vital signs and the nursing notes.  Pertinent labs & imaging results that were available during my care of the patient were reviewed by me and considered in my medical decision making (see chart for details).  Clinical Course as of Jul 04 318  Mon Jul 04, 2018  0311 Pt is chest pain free after GI cocktail   [HM]    Clinical Course User Index [HM] Ura Hausen, Dahlia Client, PA-C       Patient is to be discharged with recommendation to follow up with PCP in regards to today's hospital visit. Chest pain is not likely of cardiac or pulmonary etiology d/t presentation, PERC negative, VSS, no tracheal deviation, no JVD or new murmur, RRR, breath sounds equal bilaterally, EKG without ischemia, negative troponin, and negative CXR.  Pt has been advised to return to the ED if CP becomes exertional, associated with diaphoresis or nausea, radiates to left jaw/arm, worsens or becomes concerning in any way. No clinical evidence of influenza or COVID.  Pt appears reliable for follow up and is agreeable to discharge.   Case has been discussed with and ECG reviewed by Dr. Judd Lien who agrees with the above plan to discharge.   Final Clinical Impressions(s) / ED Diagnoses   Final diagnoses:  Central chest pain    ED Discharge Orders         Ordered    ranitidine (ZANTAC) 150 MG tablet  2 times daily     07/04/18 0312           Zeke Aker, Dahlia Client, PA-C 07/04/18 0320    Geoffery Lyons, MD 07/04/18 [phone]

## 2018-07-04 NOTE — ED Notes (Signed)
Pt asking for food  Unable until all lab tests are complete

## 2018-07-04 NOTE — ED Notes (Signed)
Pt belongings inventoried

## 2018-07-04 NOTE — ED Provider Notes (Signed)
[facility] EMERGENCY DEPARTMENT Provider Note   CSN: [id number] Arrival date & time: 07/04/18  1837    History   Chief Complaint Chief Complaint  Patient presents with  . Abdominal Pain  . Hallucinations    HPI Austin Romero is a 28 y.o. male.     The history is provided by medical records and the patient. No language interpreter was used.  Mental Health Problem  Presenting symptoms: hallucinations and suicidal thoughts   Presenting symptoms: no aggressive behavior, no agitation, no homicidal ideas, no suicidal threats and no suicide attempt   Degree of incapacity (severity):  Moderate Onset quality:  Gradual Duration:  3 days Timing:  Intermittent Progression:  Waxing and waning Chronicity:  Recurrent Context: not alcohol use, not drug abuse and not noncompliant   Treatment compliance:  Untreated Relieved by:  Nothing Worsened by:  Nothing Ineffective treatments:  None tried Associated symptoms: no abdominal pain (reoslved), no chest pain (reolved), no fatigue, no headaches and no irritability   Risk factors: hx of suicide attempts     History reviewed. No pertinent past medical history.  There are no active problems to display for this patient.   History reviewed. No pertinent surgical history.      Home Medications    Prior to Admission medications   Medication Sig Start Date End Date Taking? Authorizing Provider  dicyclomine (BENTYL) 20 MG tablet Take 1 tablet (20 mg total) by mouth 2 (two) times daily. Patient not taking: Reported on 07/04/2018 06/25/18   Austin Monday, MD  ranitidine (ZANTAC) 150 MG tablet Take 1 tablet (150 mg total) by mouth 2 (two) times daily. 07/04/18   Muthersbaugh, Dahlia Client, PA-C    Family History No family history on file.  Social History Social History   Tobacco Use  . Smoking status: Former Games developer  . Smokeless tobacco: Never Used  Substance Use Topics  . Alcohol use: Not Currently    Alcohol/week:  2.0 standard drinks    Types: 2 Cans of beer per week  . Drug use: Not Currently     Allergies   Patient has no known allergies.   Review of Systems Review of Systems  Constitutional: Negative for chills, diaphoresis, fatigue, fever and irritability.  HENT: Negative for congestion.   Eyes: Negative for visual disturbance.  Respiratory: Negative for cough, chest tightness, shortness of breath and wheezing.   Cardiovascular: Negative for chest pain (reolved), palpitations and leg swelling.  Gastrointestinal: Negative for abdominal pain (reoslved), constipation, diarrhea, nausea and vomiting.  Genitourinary: Negative for dysuria.  Musculoskeletal: Negative for back pain, neck pain and neck stiffness.  Neurological: Negative for headaches.  Psychiatric/Behavioral: Positive for hallucinations and suicidal ideas. Negative for agitation, confusion and homicidal ideas.  All other systems reviewed and are negative.    Physical Exam Updated Vital Signs There were no vitals taken for this visit.  Physical Exam Vitals signs and nursing note reviewed.  Constitutional:      General: He is not in acute distress.    Appearance: He is well-developed. He is not ill-appearing, toxic-appearing or diaphoretic.  HENT:     Head: Normocephalic and atraumatic.     Nose: No congestion or rhinorrhea.     Mouth/Throat:     Pharynx: No pharyngeal swelling, oropharyngeal exudate or posterior oropharyngeal erythema.  Eyes:     General: No scleral icterus.    Extraocular Movements: Extraocular movements intact.     Conjunctiva/sclera: Conjunctivae normal.  Neck:     Musculoskeletal:  Neck supple.  Cardiovascular:     Rate and Rhythm: Normal rate and regular rhythm.     Heart sounds: No murmur.  Pulmonary:     Effort: Pulmonary effort is normal. No respiratory distress.     Breath sounds: Normal breath sounds. No wheezing, rhonchi or rales.  Chest:     Chest wall: No tenderness.  Abdominal:      General: Bowel sounds are normal. There is no distension.     Palpations: Abdomen is soft.     Tenderness: There is no abdominal tenderness. There is no right CVA tenderness or left CVA tenderness.  Skin:    General: Skin is warm and dry.     Capillary Refill: Capillary refill takes less than 2 seconds.  Neurological:     General: No focal deficit present.     Mental Status: He is alert and oriented to person, place, and time.     Sensory: No sensory deficit.  Psychiatric:        Attention and Perception: He perceives auditory hallucinations.        Thought Content: Thought content is not paranoid or delusional. Thought content includes suicidal ideation. Thought content does not include homicidal ideation. Thought content does not include homicidal or suicidal plan.      ED Treatments / Results  Labs (all labs ordered are listed, but only abnormal results are displayed) Labs Reviewed  COMPREHENSIVE METABOLIC PANEL - Abnormal; Notable for the following components:      Result Value   Glucose, Bld 105 (*)    All other components within normal limits  ETHANOL  CBC  RAPID URINE DRUG SCREEN, HOSP PERFORMED    EKG None  Radiology Dg Chest Port 1 View  Result Date: 07/04/2018 CLINICAL DATA:  Chest pain. EXAM: PORTABLE CHEST 1 VIEW COMPARISON:  Radiograph 06/24/2018 FINDINGS: Low lung volumes.The cardiomediastinal contours are normal. The lungs are clear. Pulmonary vasculature is normal. No consolidation, pleural effusion, or pneumothorax. No acute osseous abnormalities are seen. IMPRESSION: Low lung volumes without acute abnormality. Electronically Signed   By: Narda Rutherford M.D.   On: 07/04/2018 01:00    Procedures Procedures (including critical care time)  Medications Ordered in ED Medications - No data to display   Initial Impression / Assessment and Plan / ED Course  I have reviewed the triage vital signs and the nursing notes.  Pertinent labs & imaging results that  were available during my care of the patient were reviewed by me and considered in my medical decision making (see chart for details).        Austin Romero is a 28 y.o. male with unknown past medical history who presents with "insane thoughts" and suicidal ideation.  Patient reports that he has had some abdominal cramping and chest discomfort for the last several days and had a work-up earlier today that was reassuring from a chest pain standpoint.  He reports that the symptoms have improved but he is concerned about the voices.  He reports that he is hearing voices for the last several days telling him to hurt himself.  He denies a current plan but he is having intermittent thoughts of suicide.  He reports he has never been on medications to help with his "insane thoughts" and reports no homicidal ideation.  He denies other complaints on arrival.  On exam, abdomen nontender.  Normal bowel sounds.  Lungs clear chest nontender.  Not hear a murmur which he reports he has had in the past.  Patient resting comfortably.  Patient reports SI with no plan and no HI.  He does report hearing voices telling him to hurt himself.  Patient will have screening labs for psychiatric clearance however, given his reassuring work-up for his chest pain today, do not feel he needs further cardiac work-up at this time.  Anticipate speaking with TTS for the auditory hallucinations telling him to hurt himself and SI.  Laboratory testing reassuring.  Patient is medically cleared for further psychiatric management.  TTS called.    Patient awaiting TTS recommendations.  Patient is calm and cooperative, do not feel he needs IVC at this time.  Anticipate following up on psychiatry recommendations.    Final Clinical Impressions(s) / ED Diagnoses   Final diagnoses:  Auditory hallucination  Paranoia (HCC)  Suicidal ideation    Clinical Impression: 1. Auditory hallucination   2. Paranoia (HCC)   3. Suicidal ideation      Disposition: Awaiting psychiatry recommendations for paranoia and "insane thoughts with suicidal ideation.  If patient decides to leave, do not feel he needs IVC at this time.  This note was prepared with assistance of Conservation officer, historic buildings. Occasional wrong-word or sound-a-like substitutions may have occurred due to the inherent limitations of voice recognition software.      Tegeler, Canary Brim, MD 07/05/18 [phone]

## 2018-07-04 NOTE — Discharge Instructions (Addendum)
1. Medications: zantac, usual home medications 2. Treatment: rest, drink plenty of fluids,  3. Follow Up: Please followup with your primary doctor in 2-3 days for discussion of your diagnoses and further evaluation after today's visit; if you do not have a primary care doctor use the resource guide provided to find one; Please return to the ER for worsening chest pain, vomiting, fevers, Shortness of breath or other concerns

## 2018-07-04 NOTE — ED Triage Notes (Signed)
Pt c/o abdominal pain that started today. Also reports "insane thoughts" that tell him to get himself in trouble. Denies SI/HI. Pt calm and cooperative at this time.

## 2018-07-04 NOTE — ED Notes (Signed)
See triage note  Pt alert  Acting strange

## 2018-07-04 NOTE — BH Assessment (Signed)
Clinician called Tele-Assessment Machine for [facility] Assessment at 2317 and 2332 but there was no answer. Will attempt again at a later time.

## 2018-07-04 NOTE — ED Notes (Signed)
Patient likes Coke/Ginger Ale

## 2018-07-04 NOTE — ED Triage Notes (Signed)
Pt brought in by [employer] for a c/o chest pain that began two hours ago. Pain is a constant and throbbing. Pt states that "it feels like my immunity is low and that my veins are soft." No complaints of SOB, cough, fever, or N/V. Denies recent injuries.   Pt states he drank some strawberry milk "to flush his system" and took 162 mg of ASA.

## 2018-07-04 NOTE — ED Notes (Signed)
Pt to room 14 for TTS

## 2018-07-05 ENCOUNTER — Other Ambulatory Visit: Payer: Self-pay

## 2018-07-05 ENCOUNTER — Encounter (HOSPITAL_COMMUNITY): Payer: Self-pay | Admitting: *Deleted

## 2018-07-05 ENCOUNTER — Inpatient Hospital Stay (HOSPITAL_COMMUNITY)
Admission: AD | Admit: 2018-07-05 | Discharge: 2018-07-06 | DRG: 885 | Disposition: A | Discharge status: Home / Self Care | Payer: No Typology Code available for payment source | Source: Intra-hospital | Attending: Psychiatry | Admitting: Psychiatry

## 2018-07-05 ENCOUNTER — Other Ambulatory Visit: Payer: Self-pay | Admitting: Registered Nurse

## 2018-07-05 DIAGNOSIS — Z765 Malingerer [conscious simulation]: Secondary | ICD-10-CM | POA: Diagnosis not present

## 2018-07-05 DIAGNOSIS — R45851 Suicidal ideations: Secondary | ICD-10-CM | POA: Diagnosis present

## 2018-07-05 DIAGNOSIS — F319 Bipolar disorder, unspecified: Secondary | ICD-10-CM | POA: Diagnosis present

## 2018-07-05 DIAGNOSIS — Z590 Homelessness: Secondary | ICD-10-CM | POA: Diagnosis not present

## 2018-07-05 DIAGNOSIS — Z79899 Other long term (current) drug therapy: Secondary | ICD-10-CM

## 2018-07-05 DIAGNOSIS — Z87891 Personal history of nicotine dependence: Secondary | ICD-10-CM | POA: Diagnosis not present

## 2018-07-05 DIAGNOSIS — F332 Major depressive disorder, recurrent severe without psychotic features: Secondary | ICD-10-CM | POA: Diagnosis present

## 2018-07-05 MED ORDER — FLUOXETINE HCL 20 MG PO CAPS
20.0000 mg | ORAL_CAPSULE | Freq: Every day | ORAL | Status: DC
  Administered 2018-07-05 – 2018-07-06 (×2): 20 mg via ORAL
  Filled 2018-07-05 (×5): qty 1

## 2018-07-05 MED ORDER — DICYCLOMINE HCL 20 MG PO TABS
20.0000 mg | ORAL_TABLET | Freq: Two times a day (BID) | ORAL | Status: DC
  Administered 2018-07-05: 20 mg via ORAL
  Filled 2018-07-05: qty 1

## 2018-07-05 MED ORDER — ZIPRASIDONE MESYLATE 20 MG IM SOLR: 20.0000 mg | INTRAMUSCULAR | Status: DC | PRN

## 2018-07-05 MED ORDER — ACETAMINOPHEN 325 MG PO TABS: 650.0000 mg | ORAL_TABLET | Freq: Four times a day (QID) | ORAL | Status: DC | PRN

## 2018-07-05 MED ORDER — MAGNESIUM HYDROXIDE 400 MG/5ML PO SUSP: 30.0000 mL | Freq: Every day | ORAL | Status: DC | PRN

## 2018-07-05 MED ORDER — ALUM & MAG HYDROXIDE-SIMETH 200-200-20 MG/5ML PO SUSP: 30.0000 mL | ORAL | Status: DC | PRN

## 2018-07-05 MED ORDER — HYDROXYZINE HCL 25 MG PO TABS
25.0000 mg | ORAL_TABLET | Freq: Three times a day (TID) | ORAL | Status: DC
  Administered 2018-07-05 – 2018-07-06 (×2): 25 mg via ORAL
  Filled 2018-07-05 (×9): qty 1

## 2018-07-05 MED ORDER — FAMOTIDINE 20 MG PO TABS
20.0000 mg | ORAL_TABLET | Freq: Every day | ORAL | Status: DC
  Administered 2018-07-05: 20 mg via ORAL
  Filled 2018-07-05: qty 1

## 2018-07-05 MED ORDER — LORAZEPAM 1 MG PO TABS: 1.0000 mg | ORAL_TABLET | ORAL | Status: DC | PRN

## 2018-07-05 MED ORDER — OLANZAPINE 10 MG PO TBDP: 10.0000 mg | ORAL_TABLET | Freq: Three times a day (TID) | ORAL | Status: DC | PRN

## 2018-07-05 NOTE — ED Notes (Signed)
Pt eating snack  And tolerating well.

## 2018-07-05 NOTE — ED Notes (Signed)
Ordered a regular breakfast--Sherie Dobrowolski

## 2018-07-05 NOTE — ED Notes (Signed)
Pt discharged in company of [facility]  to be transported to [facility]. Pt belongings given to [employer]

## 2018-07-05 NOTE — Progress Notes (Signed)
Austin Romero is a 28 year old male pt admitted on voluntary basis. On admission, he reports that he has been having "insane thoughts" and "hearing things" and reports this has been going on the past couple of weeks. He denies SI and able to contract for safety while in the hospital. He reports that he is not on any medications currently but does report that he was on some in the past but cannot remember what it was. He denies any substance abuse and UDS was negative. He reports that he is currently homeless and unsure of where he will go once he is discharged. Austin Romero was cooperative during admission, he was oriented to the milieu and safety maintained.

## 2018-07-05 NOTE — H&P (Signed)
Psychiatric Admission Assessment Adult  Patient Identification: Austin Romero MRN:  [id number] Date of Evaluation:  07/05/2018 Chief Complaint:  Bipolar 1 disorder most recent episode unspecified Principal Diagnosis: Homelessness/malingering Diagnosis:  Active Problems:   Bipolar disorder (HCC)   MDD (major depressive disorder), recurrent episode, severe (HCC)  History of Present Illness:   Austin Romero is 28 years of age he presented through the emergency department yesterday initially complaining of chest pain when he ruled out for any sort of cardiac event he then complained of hallucinations and suicidal thinking.  However he states voices are only inside his head and there are "insane thoughts" that he will not elaborate on.  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  His care everywhere shows numerous admissions in [place], [place], [place], [place], [place], [place], [place], [place] so forth Patient denies current thoughts of harming himself can contract here probable discharge tomorrow based on this information Associated Signs/Symptoms: Depression Symptoms:  anhedonia, (Hypo) Manic Symptoms:  At present none Anxiety Symptoms:  n/a Psychotic Symptoms:  mailgers PTSD Symptoms: NA Total Time spent with patient: 45 minutes  Is the patient at risk to self? no  Has the patient been a risk to self in the past 6 months? No.  Has the patient been a risk to self within the distant past? No.  Is the patient a risk to others? No.  Has the patient been a risk to others in the past 6 months? No.  Has the patient been a risk to others within the distant past? No.   Prior Inpatient Therapy:   Prior Outpatient Therapy:    Alcohol Screening: 1. How often do you have a drink containing alcohol?: 2 to 4 times a month 2. How many drinks containing alcohol do you have on a typical day  when you are drinking?: 1 or 2 3. How often do you have six or more drinks on one occasion?: Never AUDIT-C Score: 2 4. How often during the last year have you found that you were not able to stop drinking once you had started?: Never 5. How often during the last year have you failed to do what was normally expected from you becasue of drinking?: Never 6. How often during the last year have you needed a first drink in the morning to get yourself going after a heavy drinking session?: Never 7. How often during the last year have you had a feeling of guilt of remorse after drinking?: Never 8. How often during the last year have you been unable to remember what happened the night before because you had been drinking?: Never 9. Have you or someone else been injured as a result of your drinking?: No 10. Has a relative or friend or a doctor or another health worker been concerned about your drinking or suggested you cut down?: No Alcohol Use Disorder Identification Test Final Score (AUDIT): 2 Alcohol Brief Interventions/Follow-up: AUDIT Score <7 follow-up not indicated Substance Abuse History in the last 12 months:  Yes.   Consequences of Substance Abuse: NA Previous Psychotropic Medications: Yes  Psychological Evaluations: No  Past Medical History: History reviewed. No pertinent past medical history. History reviewed. No pertinent surgical history. Family History: History reviewed. No pertinent family history. Family Psychiatric  History: ukn Tobacco Screening: Have you used any form of tobacco in the last 30 days? (Cigarettes, Smokeless Tobacco, Cigars, and/or Pipes): No Social  History:  Social History   Substance and Sexual Activity  Alcohol Use Yes  . Alcohol/week: 2.0 standard drinks  . Types: 2 Cans of beer per week   Comment: occasionally     Social History   Substance and Sexual Activity  Drug Use Not Currently    Additional Social History:                            Allergies:  No Known Allergies Lab Results:  Results for orders placed or performed during the hospital encounter of 07/04/18 (from the past 48 hour(s))  Comprehensive metabolic panel     Status: Abnormal   Collection Time: 07/04/18  7:36 PM  Result Value Ref Range   Sodium 138 135 - 145 mmol/L   Potassium 3.7 3.5 - 5.1 mmol/L   Chloride 103 98 - 111 mmol/L   CO2 25 22 - 32 mmol/L   Glucose, Bld 105 (H) 70 - 99 mg/dL   BUN 14 6 - 20 mg/dL   Creatinine, Ser 1.610.85 0.61 - 1.24 mg/dL   Calcium 9.2 8.9 - 09.610.3 mg/dL   Total Protein 6.7 6.5 - 8.1 g/dL   Albumin 3.8 3.5 - 5.0 g/dL   AST 33 15 - 41 U/L   ALT 26 0 - 44 U/L   Alkaline Phosphatase 60 38 - 126 U/L   Total Bilirubin 0.8 0.3 - 1.2 mg/dL   GFR calc non Af Amer >60 >60 mL/min   GFR calc Af Amer >60 >60 mL/min   Anion gap 10 5 - 15    Comment: Performed at [facility], [address] [place], [place] [address]  Ethanol     Status: None   Collection Time: 07/04/18  7:36 PM  Result Value Ref Range   Alcohol, Ethyl (B) <10 <10 mg/dL    Comment: (NOTE) Lowest detectable limit for serum alcohol is 10 mg/dL. For medical purposes only. Performed at [facility], [address] [place], [place] [address]   cbc     Status: None   Collection Time: 07/04/18  7:36 PM  Result Value Ref Range   WBC 5.5 4.0 - 10.5 K/uL   RBC 4.63 4.22 - 5.81 MIL/uL   Hemoglobin 13.3 13.0 - 17.0 g/dL   HCT 14.741.2 82.939.0 - 56.252.0 %   MCV 89.0 80.0 - 100.0 fL   MCH 28.7 26.0 - 34.0 pg   MCHC 32.3 30.0 - 36.0 g/dL   RDW 13.012.0 86.511.5 - 78.415.5 %   Platelets 269 150 - 400 K/uL   nRBC 0.0 0.0 - 0.2 %    Comment: Performed at [facility], [address] [place], [place] [address]  Rapid urine drug screen (hospital performed)     Status: None   Collection Time: 07/04/18  8:25 PM  Result Value Ref Range   Opiates NONE DETECTED NONE DETECTED   Cocaine NONE DETECTED NONE DETECTED   Benzodiazepines NONE DETECTED NONE DETECTED   Amphetamines  NONE DETECTED NONE DETECTED   Tetrahydrocannabinol NONE DETECTED NONE DETECTED   Barbiturates NONE DETECTED NONE DETECTED    Comment: (NOTE) DRUG SCREEN FOR MEDICAL PURPOSES ONLY.  IF CONFIRMATION IS NEEDED FOR ANY PURPOSE, NOTIFY LAB WITHIN 5 DAYS. LOWEST DETECTABLE LIMITS FOR URINE DRUG SCREEN Drug Class                     Cutoff (ng/mL) Amphetamine and metabolites  1000 Barbiturate and metabolites    200 Benzodiazepine                 200 Tricyclics and metabolites     300 Opiates and metabolites        300 Cocaine and metabolites        300 THC                            50 Performed at [facility], [address] [place], [place] [address]     Blood Alcohol level:  Lab Results  Component Value Date   ETH <10 07/04/2018    Metabolic Disorder Labs:  No results found for: HGBA1C, MPG No results found for: PROLACTIN No results found for: CHOL, TRIG, HDL, CHOLHDL, VLDL, LDLCALC  Current Medications: Current Facility-Administered Medications  Medication Dose Route Frequency Provider Last Rate Last Dose  . acetaminophen (TYLENOL) tablet 650 mg  650 mg Oral Q6H PRN Malvin Johns, MD      . alum & mag hydroxide-simeth (MAALOX/MYLANTA) 200-200-20 MG/5ML suspension 30 mL  30 mL Oral Q4H PRN Malvin Johns, MD      . FLUoxetine (PROZAC) capsule 20 mg  20 mg Oral Daily Malvin Johns, MD      . hydrOXYzine (ATARAX/VISTARIL) tablet 25 mg  25 mg Oral TID Malvin Johns, MD      . magnesium hydroxide (MILK OF MAGNESIA) suspension 30 mL  30 mL Oral Daily PRN Malvin Johns, MD       PTA Medications: Medications Prior to Admission  Medication Sig Dispense Refill Last Dose  . dicyclomine (BENTYL) 20 MG tablet Take 1 tablet (20 mg total) by mouth 2 (two) times daily. (Patient not taking: Reported on 07/04/2018) 20 tablet 0 Not Taking at Unknown time  . ranitidine (ZANTAC) 150 MG tablet Take 1 tablet (150 mg total) by mouth 2 (two) times daily. 60 tablet 0      Musculoskeletal: Strength & Muscle Tone: within normal limits Gait & Station: normal Patient leans: N/A  Psychiatric Specialty Exam: Physical Exam  ROS  Blood pressure 114/65, pulse 88, temperature 97.7 F (36.5 C), temperature source Oral, resp. rate 18, height 5\' 6"  (1.676 m), weight 65.3 kg.Body mass index is 23.24 kg/m.  General Appearance: Casual  Eye Contact:  Good  Speech:  Clear and Coherent  Volume:  Normal  Mood:  Dysphoric  Affect:  Constricted  Thought Process:  Coherent  Orientation:  Full (Time, Place, and Person)  Thought Content:  Logical  Suicidal Thoughts:  No  Homicidal Thoughts:  No  Memory:  Immediate;   Fair  Judgement:  Fair  Insight:  Shallow  Psychomotor Activity:  Normal  Concentration:  Concentration: Fair  Recall:  [employer] of Knowledge:  Fair  Language:  Fair  Akathisia:  Negative  Handed:  Right  AIMS (if indicated):     Assets:  Communication Skills Desire for Improvement  ADL's:  Intact  Cognition:  WNL  Sleep:       Treatment Plan Summary: Daily contact with patient to assess and evaluate symptoms and progress in treatment, Medication management and Plan Monitor overnight  Observation Level/Precautions:  15 minute checks  Laboratory:  UDS  Psychotherapy:    Medications:    Consultations:    Discharge Concerns:    Estimated LOS:  Other:     Physician Treatment Plan for Primary Diagnosis: <principal problem not specified> Long Term Goal(s):  Improvement in symptoms so as ready for discharge  Short Term Goals: Ability to demonstrate self-control will improve  Physician Treatment Plan for Secondary Diagnosis: Active Problems:   Bipolar disorder (HCC)   MDD (major depressive disorder), recurrent episode, severe (HCC)  Long Term Goal(s): Improvement in symptoms so as ready for discharge  Short Term Goals: Ability to identify and develop effective coping behaviors will improve  I certify that inpatient services  furnished can reasonably be expected to improve the patient's condition.    Malvin Johns, MD 3/24/20202:39 PM

## 2018-07-05 NOTE — Progress Notes (Signed)
  Nelly Rout, MD is the accepting provider.  Malvin Johns, MD is the attending provider.  Call report to [phone]  [facility] Peds ED notified. And will advise patient's nurse Pt is Voluntary.  Pt may be transported by [facility]  Pt scheduled  to arrive at [facility] between 12:30-13:00  Carney Bern T. Kaylyn Lim, MSW, LCSWA Disposition Clinical Social Work [phone] (cell) [phone] (office)
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
## 2018-07-05 NOTE — Tx Team (Signed)
Initial Treatment Plan 07/05/2018 1:57 PM [name] [id number]    PATIENT STRESSORS: Financial difficulties Medication change or noncompliance   PATIENT STRENGTHS: Ability for insight Average or above average intelligence Capable of independent living General fund of knowledge   PATIENT IDENTIFIED PROBLEMS: Psychosis "I have been hearing things and having insane thoughts"                     DISCHARGE CRITERIA:  Ability to meet basic life and health needs Improved stabilization in mood, thinking, and/or behavior Verbal commitment to aftercare and medication compliance  PRELIMINARY DISCHARGE PLAN: Attend aftercare/continuing care group  PATIENT/FAMILY INVOLVEMENT: This treatment plan has been presented to and reviewed with the patient, Austin Romero, and/or family member, .  The patient and family have been given the opportunity to ask questions and make suggestions.  Riel Hirschman, [place], [place] 07/05/2018, 1:57 PM

## 2018-07-05 NOTE — ED Notes (Signed)
Pt wakes easily and speaks easily. States he still has headache and abdominal pain. Breakfast is delivered and Pt states his abdominal pain is not too bad to eat.

## 2018-07-05 NOTE — ED Notes (Signed)
Pt eating lunch and tolerating well. Pt offered to call family and pt declines.

## 2018-07-05 NOTE — ED Notes (Signed)
Pt oob to bathroom with steady gait. Pt ate 50% of breakfast.
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
## 2018-07-06 NOTE — BHH Counselor (Signed)
Adult Comprehensive Assessment  Patient ID: Beryle Loiseau, male   DOB: [birth year], 28 y.o.   MRN: [id number]  Information Source: Information source: Patient  Current Stressors:  Patient states their goals for this hospitilization and ongoing recovery are:: to make my mind as clear as the ocean Educational / Learning stressors: No Employment / Job issues: No Family Relationships: Sometimes Surveyor, quantity / Lack of resources (include bankruptcy): Sometimes Housing / Lack of housing: No Physical health (include injuries & life threatening diseases): Not right now Social relationships: no Substance abuse: no Bereavement / Loss: no  Living/Environment/Situation:  Living Arrangements: Alone Living conditions (as described by patient or guardian): No I am headed to [place] Who else lives in the home?: Alone How long has patient lived in current situation?: Im heading to [place] at discharge What is atmosphere in current home: Temporary  Family History:  Marital status: Single Are you sexually active?: Yes Does patient have children?: No  Childhood History:  By whom was/is the patient raised?: Mother Description of patient's relationship with caregiver when they were a child: Mom - it was close Patient's description of current relationship with people who raised him/her: Mom - deceased How were you disciplined when you got in trouble as a child/adolescent?: spankings Does patient have siblings?: Yes Number of Siblings: 1 Description of patient's current relationship with siblings: sister - we were cool but not close now  Did patient suffer any verbal/emotional/physical/sexual abuse as a child?: No Did patient suffer from severe childhood neglect?: No Has patient ever been sexually abused/assaulted/raped as an adolescent or adult?: No Was the patient ever a victim of a crime or a disaster?: No Witnessed domestic violence?: No Has patient been effected by domestic violence as an adult?:  No  Education:  Highest grade of school patient has completed: Graduated high school Currently a student?: No Learning disability?: No  Employment/Work Situation:   Employment situation: Unemployed What is the longest time patient has a held a job?: 1 year and 4 months 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  Type of faith/religion: Yeah I believe  How does patient's faith help to cope with current illness?: It helps  Leisure/Recreation:   Leisure and Hobbies: videogames  Strengths/Needs:   What is the patient's perception of their strengths?: cleaning , yard work Patient states they can use these personal strengths during their treatment to contribute to their recovery: i dont know Patient states these barriers may affect/interfere with their treatment: denies  Patient states these barriers may affect their return to the community: denies   Discharge Plan:   Currently receiving community mental health services: No Patient states concerns and preferences for aftercare planning are: That would be cool if you help find a doctor in [place]. Anything that will help be centered to have a career, car and home.  Patient states they will know when they are safe and ready for discharge when: Pt replied "no" Does patient have access to transportation?: No  Does patient have financial barriers related to discharge medications?: Yes(no insurance or  income) Patient description of barriers related to discharge medications: no income, cant afford them.  Plan for no access to transportation at discharge: no idea Plan for living situation after discharge: Reports moving to [place] to start over Will patient be returning to same living situation after discharge?: No  Summary/Recommendations:   Summary and Recommendations (to be completed by the evaluator): Patient is a 28 year old male admitted with suicidal ideation and hallucinations. Patient initially came into the Emergency Department due to chest pain. Patient has a diagnosis of Bipolar I disorder and Major Depressive Disorder. Patient reports his goal is to get his mind right at this hospital stay. Patient reports wanting aftercare services to help him with his goal set up in [facility]. CSW provided him with the mobile crisis number and local walk in crisis clinic information in this area. Patient has had numerous admissions across [facility], in [place], [place] and [place] also. Patient is currently homeless and last documented address was in [place]. Patient will benefit from crisis stabilization, medication evaluation, group therapy and psychoeducation, in addition to case management for discharge planning. At discharge it is recommended that Patient adhere to the established discharge plan and continue in treatment.  Shellia Cleverly. 07/06/2018

## 2018-07-06 NOTE — Progress Notes (Signed)
Recreation Therapy Notes  Date: 3.25.20 Time: 1000 Location: 500 Hall Dayroom  Group Topic: Triggers  Goal Area(s) Addresses:  Patient will identify three biggest triggers. Patient will identify how they avoid triggers. Patient will verbalize how they deal with triggers head on.  Intervention:  Worksheet, pencils    Activity: Triggers.  Patients were to identify their biggest triggers.  Patients were to then identify how they avoid triggers and how they deal with them head on when they can't be avoided.  Education:Communication, Discharge Planning  Education Outcome: Acknowledges understanding/In group clarification offered/Needs additional education.   Clinical Observations/Feedback:  Pt did not attend group.       Caroll Rancher, LRT/CTRS         Caroll Rancher A 07/06/2018 12:21 PM

## 2018-07-06 NOTE — BHH Suicide Risk Assessment (Signed)
B[facility] INPATIENT:  Family/Significant Other Suicide Prevention Education  Suicide Prevention Education:  Patient Refusal for Family/Significant Other Suicide Prevention Education: The patient Jontez Stitt has refused to provide written consent for family/significant other to be provided Family/Significant Other Suicide Prevention Education during admission and/or prior to discharge.  Physician notified.  CSW reviewed information with patient. Patient given the mobile crisis number and Suicide prevention brochure with information on it also.   Shellia Cleverly 07/06/2018, 1:20 PM

## 2018-07-06 NOTE — BHH Suicide Risk Assessment (Signed)
[facility] INPATIENT:  Family/Significant Other Suicide Prevention Education  Suicide Prevention Education:  Patient Refusal for Family/Significant Other Suicide Prevention Education: The patient Austin Romero has refused to provide written consent for family/significant other to be provided Family/Significant Other Suicide Prevention Education during admission and/or prior to discharge.  Physician notified.  SPE completed with patient, as patient refused to consent to family contact. SPI pamphlet provided to pt and pt was encouraged to share information with support network, ask questions, and talk about any concerns relating to SPE. Patient denies access to guns/firearms and verbalized understanding of information provided. Mobile Crisis information also provided to patient.    [name] 07/06/2018, 10:37 AM

## 2018-07-06 NOTE — Progress Notes (Signed)
  [facility] Adult Case Management Discharge Plan :  Will you be returning to the same living situation after discharge: No, patient reports he is discharging to [place]. He did not disclose a specific destination, city or county.  At discharge, do you have transportation home?: Yes,  bus pass, PART bus passes provided.  Do you have the ability to pay for your medications: No.  Release of information consent forms completed and in the chart;  Patient's signature needed at discharge.  Patient to Follow up at: Follow-up Information    [facility] Follow up.   Why:  At this time Lawyer can not schedule appointments for patient. Please call within 3 days after discharge to schedule services with clinic.  Contact information: [address] [place] [address] Phone: [phone] Fax: [phone]          Next level of care provider has access to [facility] Link:yes  Safety Planning and Suicide Prevention discussed: Yes,  with the patient   Have you used any form of tobacco in the last 30 days? (Cigarettes, Smokeless Tobacco, Cigars, and/or Pipes): No  Has patient been referred to the Quitline?: N/A patient is not a smoker  Patient has been referred for addiction treatment: N/A  [name], LCSWA 07/06/2018, 10:25 AM

## 2018-07-06 NOTE — Tx Team (Signed)
Interdisciplinary Treatment and Diagnostic Plan Update  07/06/2018 Time of Session: 9:00am Austin Romero MRN: [id number]  Principal Diagnosis: <principal problem not specified>  Secondary Diagnoses: Active Problems:   Bipolar disorder (HCC)   MDD (major depressive disorder), recurrent episode, severe (HCC)   Current Medications:  Current Facility-Administered Medications  Medication Dose Route Frequency Provider Last Rate Last Dose  . acetaminophen (TYLENOL) tablet 650 mg  650 mg Oral Q6H PRN Malvin Johns, MD      . alum & mag hydroxide-simeth (MAALOX/MYLANTA) 200-200-20 MG/5ML suspension 30 mL  30 mL Oral Q4H PRN Malvin Johns, MD      . FLUoxetine (PROZAC) capsule 20 mg  20 mg Oral Daily Malvin Johns, MD   20 mg at 07/06/18 0800  . hydrOXYzine (ATARAX/VISTARIL) tablet 25 mg  25 mg Oral TID Malvin Johns, MD   25 mg at 07/06/18 0800  . OLANZapine zydis (ZYPREXA) disintegrating tablet 10 mg  10 mg Oral Q8H PRN Kerry Hough, PA-C       And  . LORazepam (ATIVAN) tablet 1 mg  1 mg Oral PRN Donell Sievert E, PA-C       And  . ziprasidone (GEODON) injection 20 mg  20 mg Intramuscular PRN Donell Sievert E, PA-C      . magnesium hydroxide (MILK OF MAGNESIA) suspension 30 mL  30 mL Oral Daily PRN Malvin Johns, MD       PTA Medications: Medications Prior to Admission  Medication Sig Dispense Refill Last Dose  . dicyclomine (BENTYL) 20 MG tablet Take 1 tablet (20 mg total) by mouth 2 (two) times daily. (Patient not taking: Reported on 07/04/2018) 20 tablet 0 Not Taking at Unknown time  . ranitidine (ZANTAC) 150 MG tablet Take 1 tablet (150 mg total) by mouth 2 (two) times daily. 60 tablet 0     Patient Stressors: Financial difficulties Medication change or noncompliance  Patient Strengths: Ability for insight Average or above average intelligence Capable of independent living General fund of knowledge  Treatment Modalities: Medication Management, Group therapy, Case management,  1  to 1 session with clinician, Psychoeducation, Recreational therapy.   Physician Treatment Plan for Primary Diagnosis: <principal problem not specified> Long Term Goal(s): Improvement in symptoms so as ready for discharge Improvement in symptoms so as ready for discharge   Short Term Goals: Ability to demonstrate self-control will improve Ability to identify and develop effective coping behaviors will improve  Medication Management: Evaluate patient's response, side effects, and tolerance of medication regimen.  Therapeutic Interventions: 1 to 1 sessions, Unit Group sessions and Medication administration.  Evaluation of Outcomes: Adequate for Discharge  Physician Treatment Plan for Secondary Diagnosis: Active Problems:   Bipolar disorder [facility])   MDD (major depressive disorder), recurrent episode, severe (HCC)  Long Term Goal(s): Improvement in symptoms so as ready for discharge Improvement in symptoms so as ready for discharge   Short Term Goals: Ability to demonstrate self-control will improve Ability to identify and develop effective coping behaviors will improve     Medication Management: Evaluate patient's response, side effects, and tolerance of medication regimen.  Therapeutic Interventions: 1 to 1 sessions, Unit Group sessions and Medication administration.  Evaluation of Outcomes: Adequate for Discharge   RN Treatment Plan for Primary Diagnosis: <principal problem not specified> Long Term Goal(s): Knowledge of disease and therapeutic regimen to maintain health will improve  Short Term Goals: Ability to identify and develop effective coping behaviors will improve and Compliance with prescribed medications will improve  Medication Management: RN  will administer medications as ordered by provider, will assess and evaluate patient's response and provide education to patient for prescribed medication. RN will report any adverse and/or side effects to prescribing  provider.  Therapeutic Interventions: 1 on 1 counseling sessions, Psychoeducation, Medication administration, Evaluate responses to treatment, Monitor vital signs and CBGs as ordered, Perform/monitor CIWA, COWS, AIMS and Fall Risk screenings as ordered, Perform wound care treatments as ordered.  Evaluation of Outcomes: Adequate for Discharge   LCSW Treatment Plan for Primary Diagnosis: <principal problem not specified> Long Term Goal(s): Safe transition to appropriate next level of care at discharge, Engage patient in therapeutic group addressing interpersonal concerns.  Short Term Goals: Engage patient in aftercare planning with referrals and resources and Increase skills for wellness and recovery  Therapeutic Interventions: Assess for all discharge needs, 1 to 1 time with Social worker, Explore available resources and support systems, Assess for adequacy in community support network, Educate family and significant other(s) on suicide prevention, Complete Psychosocial Assessment, Interpersonal group therapy.  Evaluation of Outcomes: Adequate for Discharge   Progress in Treatment: Attending groups: No. Participating in groups: No. Taking medication as prescribed: Yes. Toleration medication: Yes. Family/Significant other contact made: Yes, individual(s) contacted:  completed SPE with patient, patient declined other consents Patient understands diagnosis: Yes. Discussing patient identified problems/goals with staff: Yes. Medical problems stabilized or resolved: Yes. Denies suicidal/homicidal ideation: Yes. Issues/concerns per patient self-inventory: No.  New problem(s) identified: Yes, Describe:  homeless, limited social supports  New Short Term/Long Term Goal(s):medication management for mood stabilization; elimination of SI thoughts; development of comprehensive mental wellness/sobriety plan.  Patient Goals:  Wants to discharge and head to [place]  Discharge Plan or Barriers:  discharge today  Reason for Continuation of Hospitalization: Anxiety  Estimated Length of Stay: discharge today  Attendees: Patient: 07/06/2018 1:16 PM  Physician:  07/06/2018 1:16 PM  Nursing:  07/06/2018 1:16 PM  RN Care Manager: 07/06/2018 1:16 PM  Social Worker: Enid Cutter, LCSWA 07/06/2018 1:16 PM  Recreational Therapist:  07/06/2018 1:16 PM  Other:  07/06/2018 1:16 PM  Other:  07/06/2018 1:16 PM  Other: 07/06/2018 1:16 PM    Scribe for Treatment Team: Darreld Mclean, LCSWA 07/06/2018 1:16 PM

## 2018-07-06 NOTE — Progress Notes (Signed)
CSW informed by attending psychiatrist that the patient is scheduled for discharge. CSW spoke with the patient regarding his discharge plans and possible referrals. The patient reports that he is not staying in [place] and that he plans to "hitchhike" to [place]. CSW asked the patient where he was going in [place] and the patient did not respond. The patient shared that he wanted to hitchhike to [place] to "start his life over". CSW explained to the patient that CSW department could not provide an actual bus ticket to [place], however we   CSW provided the patient with outpatient resources for [facility] in order for him to have follow up before he went to [place]. Also provided were transportation resources that included a city bus pass and a PART bus pass.    Baldo Daub, MSW, LCSWA Clinical Social Worker [facility]  Phone: [phone]

## 2018-07-06 NOTE — Progress Notes (Signed)
D: Patient observed resting in bed since start of shift last evening. NAD, no complaints or needs voiced. This morning patient states, "I slept ok. I woke up around 3 but went back to sleep on and off. I'm still hearing voices telling me to trap myself." Patient's affect flat, mood anxious. Denies pain, physical complaints.   A: No medications given over night as none were scheduled. No prns requested or required.  Level III obs in place for safety. Emotional support offered. Encouraged to attend and participate in unit programming.   R: Patient verbalizes understanding of POC. Patient denies SI/HI/VH and remains safe on level III obs. Will continue to monitor throughout the night.

## 2018-07-06 NOTE — BHH Suicide Risk Assessment (Signed)
[facility] Discharge Suicide Risk Assessment   Principal Problem: Homelessness/malingering Discharge Diagnoses: Active Problems:   Bipolar disorder (HCC)   MDD (major depressive disorder), recurrent episode, severe (HCC)  Patient acknowledging no true hallucinations or suicidality that he is seeking housing through hospitalization repeatedly Generally pleasant about discussing this  Total Time spent with patient: 45 minutes Mental Status Per Nursing Assessment::   On Admission:  NA  Demographic Factors:  Male and Low socioeconomic status  Loss Factors: Decrease in vocational status  Historical Factors: NA  Risk Reduction Factors:   Religious beliefs about death  Continued Clinical Symptoms:  More than one psychiatric diagnosis  Cognitive Features That Contribute To Risk:  None    Suicide Risk:  Minimal: No identifiable suicidal ideation.  Patients presenting with no risk factors but with morbid ruminations; may be classified as minimal risk based on the severity of the depressive symptoms    Plan Of Care/Follow-up recommendations:  Activity:  full  Jak Haggar, MD 07/06/2018, 9:25 AM

## 2018-07-06 NOTE — BHH Counselor (Signed)
CSW attempted 2x to meet with patient to complete PSA. The first time pt was in the shower or shower was running. The second pt reported he had to go to the bathroom and could not talk.

## 2018-07-06 NOTE — Discharge Summary (Signed)
Physician Discharge Summary Note  Patient:  Austin Romero is an 28 y.o., male MRN:  [id number] DOB:  Dec 10, 1990 Patient phone:  [phone] (home)  Patient address:   [place] [address],  Total Time spent with patient: 45 minutes  Date of Admission:  07/05/2018 Date of Discharge: 07/06/18  Reason for Admission:    [name] is 28 years of age he is basically homeless drifter he has been hospitalized from [place] to [place] and acknowledges that he endorses chest pain and/or suicidal thinking to obtain housing he was monitored overnight to make sure he really was not suicidal by the more the 25th he requested discharge home.  Principal Problem: Malingering Discharge Diagnoses: Active Problems:   Bipolar disorder (HCC)   MDD (major depressive disorder), recurrent episode, severe (HCC)   Past Psychiatric History: see HPI  Past Medical History: History reviewed. No pertinent past medical history. History reviewed. No pertinent surgical history. Family History: History reviewed. No pertinent family history. Family Psychiatric  History: Denied social History:  Social History   Substance and Sexual Activity  Alcohol Use Yes  . Alcohol/week: 2.0 standard drinks  . Types: 2 Cans of beer per week   Comment: occasionally     Social History   Substance and Sexual Activity  Drug Use Not Currently    Social History   Socioeconomic History  . Marital status: Single    Spouse name: Not on file  . Number of children: Not on file  . Years of education: Not on file  . Highest education level: Not on file  Occupational History  . Not on file  Social Needs  . Financial resource strain: Not on file  . Food insecurity:    Worry: Not on file    Inability: Not on file  . Transportation needs:    Medical: Not on file    Non-medical: Not on file  Tobacco Use  . Smoking status: Former Games developer  . Smokeless tobacco: Never Used  Substance and Sexual Activity  . Alcohol use:  Yes    Alcohol/week: 2.0 standard drinks    Types: 2 Cans of beer per week    Comment: occasionally  . Drug use: Not Currently  . Sexual activity: Yes  Lifestyle  . Physical activity:    Days per week: Not on file    Minutes per session: Not on file  . Stress: Not on file  Relationships  . Social connections:    Talks on phone: Not on file    Gets together: Not on file    Attends religious service: Not on file    Active member of club or organization: Not on file    Attends meetings of clubs or organizations: Not on file    Relationship status: Not on file  Other Topics Concern  . Not on file  Social History Narrative  . Not on file    Hospital Course:    Patient was admitted overnight for a period of observation From the outset when given any even mildly confrontational questions he acknowledged that he was basically here for housing purposes. Was monitored and displayed no danger behaviors by the morning of the 25th fully confessed that he was seeking hospitalization for homelessness that he often reports chest pain and/or suicidal thinking and/or psychosis in order to obtain housing and this is documented very clearly in care everywhere At the point of discharge alert oriented actually pleasant about this denies wanting to harm self or others agrees to  discharge no manipulation at this point no threats no suicidal thinking no homicidal thinking no psychosis  Physical Findings: AIMS: Facial and Oral Movements Muscles of Facial Expression: None, normal Lips and Perioral Area: None, normal Jaw: None, normal Tongue: None, normal,Extremity Movements Upper (arms, wrists, hands, fingers): None, normal Lower (legs, knees, ankles, toes): None, normal, Trunk Movements Neck, shoulders, hips: None, normal, Overall Severity Severity of abnormal movements (highest score from questions above): None, normal Incapacitation due to abnormal movements: None, normal Patient's awareness of  abnormal movements (rate only patient's report): No Awareness, Dental Status Current problems with teeth and/or dentures?: No Does patient usually wear dentures?: No  CIWA:    COWS:     Musculoskeletal: Strength & Muscle Tone: within normal limits Gait & Station: normal Patient leans: N/A  Psychiatric Specialty Exam: Physical Exam  ROS  Blood pressure 114/65, pulse 88, temperature 97.7 F (36.5 C), temperature source Oral, resp. rate 18, height 5\' 6"  (1.676 m), weight 65.3 kg.Body mass index is 23.24 kg/m.  General Appearance: Disheveled  Eye Contact:  Good  Speech:  Clear and Coherent  Volume:  Normal  Mood:  Euthymic  Affect:  Congruent and Constricted  Thought Process:  Coherent  Orientation:  Full (Time, Place, and Person)  Thought Content:  Logical  Suicidal Thoughts:  No  Homicidal Thoughts:  No  Memory:  Immediate;   Good  Judgement:  Good  Insight:  Good  Psychomotor Activity:  Normal  Concentration:  Concentration: Good  Recall:  Good  Fund of Knowledge:  Good  Language:  Good  Akathisia:  Negative  Handed:  Right  AIMS (if indicated):     Assets:  Physical Health Resilience  ADL's:  Intact  Cognition:  WNL  Sleep:  Number of Hours: 4.25     Have you used any form of tobacco in the last 30 days? (Cigarettes, Smokeless Tobacco, Cigars, and/or Pipes): No  Has this patient used any form of tobacco in the last 30 days? (Cigarettes, Smokeless Tobacco, Cigars, and/or Pipes) Yes, No  Blood Alcohol level:  Lab Results  Component Value Date   ETH <10 07/04/2018    Metabolic Disorder Labs:  No results found for: HGBA1C, MPG No results found for: PROLACTIN No results found for: CHOL, TRIG, HDL, CHOLHDL, VLDL, LDLCALC  See Psychiatric Specialty Exam and Suicide Risk Assessment completed by Attending Physician prior to discharge.  Discharge destination:  Home  Is patient on multiple antipsychotic therapies at discharge:  No   Has Patient had three or more  failed trials of antipsychotic monotherapy by history:  No  Recommended Plan for Multiple Antipsychotic Therapies: NA   Allergies as of 07/06/2018   No Known Allergies     Medication List    STOP taking these medications   dicyclomine 20 MG tablet Commonly known as:  BENTYL   ranitidine 150 MG tablet Commonly known as:  ZANTAC        Follow-up recommendations:  Activity:  full  Comments: Patient requesting a bus pass for the maximum number of miles allowable apparently wants to leave the state and go back to [place]  SignedMalvin Johns, MD 07/06/2018, 9:27 AM

## 2018-07-07 ENCOUNTER — Other Ambulatory Visit: Payer: Self-pay

## 2018-07-07 ENCOUNTER — Encounter (HOSPITAL_COMMUNITY): Payer: Self-pay

## 2018-07-07 ENCOUNTER — Emergency Department (HOSPITAL_COMMUNITY): Payer: Self-pay

## 2018-07-07 ENCOUNTER — Emergency Department (HOSPITAL_COMMUNITY)
Admission: EM | Admit: 2018-07-07 | Discharge: 2018-07-07 | Disposition: A | Discharge status: Home / Self Care | Payer: Self-pay | Attending: Emergency Medicine | Admitting: Emergency Medicine

## 2018-07-07 DIAGNOSIS — Z87891 Personal history of nicotine dependence: Secondary | ICD-10-CM | POA: Insufficient documentation

## 2018-07-07 DIAGNOSIS — R0789 Other chest pain: Secondary | ICD-10-CM | POA: Insufficient documentation

## 2018-07-07 MED ORDER — IBUPROFEN 800 MG PO TABS: 800.0000 mg | ORAL_TABLET | Freq: Three times a day (TID) | ORAL | 0 refills | Status: AC

## 2018-07-07 NOTE — ED Provider Notes (Signed)
[facility]-EMERGENCY DEPT Provider Note   CSN: [id number] Arrival date & time: 07/07/18  0137    History   Chief Complaint Chief Complaint  Patient presents with  . Chest Pain    HPI Austin Romero is a 28 y.o. male.     Patient presents to the emergency department with a chief complaint of chest pain.  He states pain is been ongoing for "quite some time."  He states that it is worsened with palpation.  He denies any fevers, chills, shortness of breath, or cough.  Denies any treatments prior to arrival.  Denies any other associated symptoms.  Denies any recent illnesses.  The history is provided by the patient. No language interpreter was used.    History reviewed. No pertinent past medical history.  Patient Active Problem List   Diagnosis Date Noted  . Bipolar disorder (HCC) 07/05/2018  . MDD (major depressive disorder), recurrent episode, severe (HCC) 07/05/2018    History reviewed. No pertinent surgical history.      Home Medications    Prior to Admission medications   Not on File    Family History History reviewed. No pertinent family history.  Social History Social History   Tobacco Use  . Smoking status: Former Games developer  . Smokeless tobacco: Never Used  Substance Use Topics  . Alcohol use: Yes    Alcohol/week: 2.0 standard drinks    Types: 2 Cans of beer per week    Comment: occasionally  . Drug use: Not Currently     Allergies   Patient has no known allergies.   Review of Systems Review of Systems  All other systems reviewed and are negative.    Physical Exam Updated Vital Signs BP 126/85   Pulse 76   Temp 97.9 F (36.6 C) (Oral)   Resp 11   Ht 5\' 6"  (1.676 m)   Wt 68 kg   SpO2 96%   BMI 24.21 kg/m   Physical Exam Vitals signs and nursing note reviewed.  Constitutional:      Appearance: He is well-developed.  HENT:     Head: Normocephalic and atraumatic.  Eyes:     General: No scleral icterus.     Right eye: No discharge.        Left eye: No discharge.     Conjunctiva/sclera: Conjunctivae normal.     Pupils: Pupils are equal, round, and reactive to light.  Neck:     Musculoskeletal: Normal range of motion and neck supple.     Vascular: No JVD.  Cardiovascular:     Rate and Rhythm: Normal rate and regular rhythm.     Heart sounds: Normal heart sounds. No murmur. No friction rub. No gallop.   Pulmonary:     Effort: Pulmonary effort is normal. No respiratory distress.     Breath sounds: Normal breath sounds. No wheezing or rales.  Chest:     Chest wall: No tenderness.  Abdominal:     General: There is no distension.     Palpations: Abdomen is soft. There is no mass.     Tenderness: There is no abdominal tenderness. There is no guarding or rebound.  Musculoskeletal: Normal range of motion.        General: No tenderness.  Skin:    General: Skin is warm and dry.  Neurological:     Mental Status: He is alert and oriented to person, place, and time.  Psychiatric:        Behavior: Behavior normal.  Thought Content: Thought content normal.        Judgment: Judgment normal.      ED Treatments / Results  Labs (all labs ordered are listed, but only abnormal results are displayed) Labs Reviewed - No data to display  EKG  Radiology Dg Chest 2 View  Result Date: 07/07/2018 CLINICAL DATA:  Awoke with chest pain. EXAM: CHEST - 2 VIEW COMPARISON:  Radiographs 3 days prior 07/03/2018, also 06/24/2018 FINDINGS: Patient is rotated to the right.The cardiomediastinal contours are normal. The lungs are clear. Pulmonary vasculature is normal. No consolidation, pleural effusion, or pneumothorax. No acute osseous abnormalities are seen. IMPRESSION: No acute chest findings. Electronically Signed   By: Narda Rutherford M.D.   On: 07/07/2018 03:01    Procedures Procedures (including critical care time)  Medications Ordered in ED Medications - No data to display   Initial Impression  / Assessment and Plan / ED Course  I have reviewed the triage vital signs and the nursing notes.  Pertinent labs & imaging results that were available during my care of the patient were reviewed by me and considered in my medical decision making (see chart for details).        Patient with anterior chest wall tenderness that is been ongoing for several weeks.  Denies any recent illnesses.  Denies any fever, chills, cough, shortness of breath.  Vital signs are stable.  No fever.  Chest x-ray is negative.  EKG shows no ischemic changes.  As the symptoms are easily reproducible with palpation, the patient is low risk for ACS given his age and history, feel that patient is stable for discharge and outpatient follow-up.  Will discharge with ibuprofen.  Final Clinical Impressions(s) / ED Diagnoses   Final diagnoses:  Chest wall pain    ED Discharge Orders    None       Roxy Horseman, PA-C 07/07/18 [id number]    Derwood Kaplan, MD 07/07/18 [phone]

## 2018-07-07 NOTE — ED Notes (Signed)
Pt given coke, sandwich, and crackers per request.

## 2018-07-07 NOTE — ED Triage Notes (Addendum)
Pt BIB [facility] complaining of chest wall pain that hurts worse when palpated. Also endorses cold sweats. States symptoms started about 5-6 hours ago. He states that it also happened 2 weeks ago after he was using a vape pen and was given zyprexa and it has started again. He states that he has not smoked anything in 2 weeks. Denies fever, cough, SOB.

## 2018-07-08 ENCOUNTER — Encounter (HOSPITAL_BASED_OUTPATIENT_CLINIC_OR_DEPARTMENT_OTHER): Payer: Self-pay | Admitting: Emergency Medicine

## 2018-07-08 ENCOUNTER — Other Ambulatory Visit: Payer: Self-pay

## 2018-07-08 ENCOUNTER — Emergency Department (HOSPITAL_BASED_OUTPATIENT_CLINIC_OR_DEPARTMENT_OTHER)
Admission: EM | Admit: 2018-07-08 | Discharge: 2018-07-08 | Disposition: A | Discharge status: Home / Self Care | Payer: Self-pay | Attending: Emergency Medicine | Admitting: Emergency Medicine

## 2018-07-08 DIAGNOSIS — R072 Precordial pain: Secondary | ICD-10-CM

## 2018-07-08 DIAGNOSIS — Z87891 Personal history of nicotine dependence: Secondary | ICD-10-CM | POA: Insufficient documentation

## 2018-07-08 DIAGNOSIS — Z590 Homelessness: Secondary | ICD-10-CM | POA: Insufficient documentation

## 2018-07-08 HISTORY — DX: Chest pain, unspecified: R07.9

## 2018-07-08 HISTORY — DX: Homelessness: Z59.0

## 2018-07-08 HISTORY — DX: Auditory hallucinations: R44.0

## 2018-07-08 HISTORY — DX: Other chronic pain: G89.29

## 2018-07-08 HISTORY — DX: Anal fissure, unspecified: K60.2

## 2018-07-08 HISTORY — DX: Homelessness unspecified: Z59.00

## 2018-07-08 HISTORY — DX: Malingerer (conscious simulation): Z76.5

## 2018-07-08 HISTORY — DX: Schizophrenia, unspecified: F20.9

## 2018-07-08 HISTORY — DX: Gastro-esophageal reflux disease without esophagitis: K21.9

## 2018-07-08 NOTE — ED Provider Notes (Signed)
[facility] EMERGENCY DEPARTMENT Provider Note   CSN: [id number] Arrival date & time: 07/08/18  0215    History   Chief Complaint Chief Complaint  Patient presents with  . Back Pain    HPI Austin Romero is a 28 y.o. male.     The history is provided by the patient and the EMS personnel.  Chest Pain  Pain location:  Substernal area Pain quality: aching   Pain severity:  Mild Onset quality:  Gradual Timing:  Constant Chronicity:  Chronic Relieved by:  Nothing Worsened by:  Certain positions (palpation) Associated symptoms: back pain   Associated symptoms: no cough, no fever, no shortness of breath, no vomiting and no weakness   Patient presents for her fifth ER visit in 6 months.  He presents tonight with chest pain.  He has had multiple evaluations before.  He reports the pain is worse with palpation and certain positions  Past Medical History:  Diagnosis Date  . Anal fissure   . Auditory hallucination   . Chronic chest pain   . GERD (gastroesophageal reflux disease)   . Homelessness   . Malingering   . Schizophrenia [facility])     Patient Active Problem List   Diagnosis Date Noted  . Bipolar disorder (HCC) 07/05/2018  . MDD (major depressive disorder), recurrent episode, severe (HCC) 07/05/2018    History reviewed. No pertinent surgical history.      Home Medications    Prior to Admission medications   Medication Sig Start Date End Date Taking? Authorizing Provider  ibuprofen (ADVIL,MOTRIN) 800 MG tablet Take 1 tablet (800 mg total) by mouth 3 (three) times daily. 07/07/18   Roxy Horseman, PA-C    Family History History reviewed. No pertinent family history.  Social History Social History   Tobacco Use  . Smoking status: Former Games developer  . Smokeless tobacco: Never Used  Substance Use Topics  . Alcohol use: Yes    Alcohol/week: 2.0 standard drinks    Types: 2 Cans of beer per week    Comment: occasionally  . Drug use: Not Currently      Allergies   Patient has no known allergies.   Review of Systems Review of Systems  Constitutional: Negative for fever.  Respiratory: Negative for cough and shortness of breath.   Cardiovascular: Positive for chest pain.  Gastrointestinal: Negative for vomiting.  Musculoskeletal: Positive for back pain.  Neurological: Negative for weakness.     Physical Exam Updated Vital Signs BP 129/84 (BP Location: Right Arm)   Pulse 79   Temp 97.7 F (36.5 C) (Oral)   Resp 16   Ht 1.676 m (5\' 6" )   Wt 68 kg   SpO2 100%   BMI 24.21 kg/m   Physical Exam CONSTITUTIONAL: disheveled, no acute distress HEAD: Normocephalic/atraumatic EYES: EOMI  ENMT: Mucous membranes moist NECK: supple no meningeal signs Spine - no T/L tenderness, No bruising/crepitance/stepoffs noted to spine CV: S1/S2 noted, no murmurs/rubs/gallops noted LUNGS: Lungs are clear to auscultation bilaterally, no apparent distress Chest-tenderness to central chest.  No crepitus.  No signs of trauma ABDOMEN: soft, nontender NEURO: Pt is awake/alert/appropriate, moves all extremitiesx4.  No facial droop.  Walks around the ER in no acute distress EXTREMITIES: pulses normal/equal, full ROM SKIN: warm, color normal PSYCH: no abnormalities of mood noted, alert and oriented to situation   ED Treatments / Results  Labs (all labs ordered are listed, but only abnormal results are displayed) Labs Reviewed - No data to display  EKG  None  Radiology Dg Chest 2 View  Result Date: 07/07/2018 CLINICAL DATA:  Awoke with chest pain. EXAM: CHEST - 2 VIEW COMPARISON:  Radiographs 3 days prior 07/03/2018, also 06/24/2018 FINDINGS: Patient is rotated to the right.The cardiomediastinal contours are normal. The lungs are clear. Pulmonary vasculature is normal. No consolidation, pleural effusion, or pneumothorax. No acute osseous abnormalities are seen. IMPRESSION: No acute chest findings. Electronically Signed   By: Narda Rutherford  M.D.   On: 07/07/2018 03:01    Procedures Procedures   Medications Ordered in ED Medications - No data to display   Initial Impression / Assessment and Plan / ED Course  I have reviewed the triage vital signs and the nursing notes.  previous XRAY reviewed       Patient presents for fifth ER visit in our system in the past 6 months.  Review of care everywhere indicates that he has had multiple ER visits throughout the [place] in the past several months with a variety of complaints including pharyngitis, anal pain, chest pain hemorrhoids.  He was recently admitted to behavioral health and admitted that he reports chest pain and suicidal thoughts so he can gain admission. EKG is unchanged.  Chest x-ray done less than 24 hours ago was reviewed and is negative.  Vital signs are appropriate.  He will be discharged  Final Clinical Impressions(s) / ED Diagnoses   Final diagnoses:  Precordial pain    ED Discharge Orders    None       Zadie Rhine, MD 07/08/18 [phone]

## 2018-07-08 NOTE — ED Triage Notes (Signed)
Patient presents via EMS with complaints of back pain and chest pain; ambulatory to room with steady gait; nad noted; seen at [facility] ED for same within the last 24 hours.

## 2018-08-11 ENCOUNTER — Emergency Department: Payer: Self-pay

## 2018-08-11 ENCOUNTER — Emergency Department
Admission: EM | Admit: 2018-08-11 | Discharge: 2018-08-11 | Disposition: A | Discharge status: Home / Self Care | Payer: Self-pay | Attending: Emergency Medicine | Admitting: Emergency Medicine

## 2018-08-11 DIAGNOSIS — R0789 Other chest pain: Secondary | ICD-10-CM | POA: Insufficient documentation

## 2018-08-11 LAB — CBC AND DIFFERENTIAL
Basophils %: 0.9 % (ref 0.0–3.0)
Basophils Absolute: 0.1 10*3/uL (ref 0.0–0.3)
Eosinophils %: 0.1 % (ref 0.0–7.0)
Eosinophils Absolute: 0 10*3/uL (ref 0.0–0.8)
Hematocrit: 44.9 % (ref 39.0–52.5)
Hemoglobin: 14.9 gm/dL (ref 13.0–17.5)
Lymphocytes Absolute: 1.6 10*3/uL (ref 0.6–5.1)
Lymphocytes: 12.7 % — ABNORMAL LOW (ref 15.0–46.0)
MCH: 30 pg (ref 28–35)
MCHC: 33 gm/dL (ref 31–36)
MCV: 90 fL (ref 80–100)
MPV: 7.3 fL (ref 6.0–10.0)
Monocytes Absolute: 1.1 10*3/uL (ref 0.1–1.7)
Monocytes: 8.7 % (ref 3.0–15.0)
Neutrophils %: 77.6 % (ref 42.0–78.0)
Neutrophils Absolute: 9.8 10*3/uL — ABNORMAL HIGH (ref 1.7–8.6)
PLT CT: 190 10*3/uL (ref 130–440)
RBC: 5 10*6/uL (ref 4.00–5.70)
RDW: 11.8 % (ref 10.5–14.5)
WBC: 12.6 10*3/uL — ABNORMAL HIGH (ref 4.0–11.0)

## 2018-08-11 LAB — COMPREHENSIVE METABOLIC PANEL
ALT: 27 U/L (ref 0–55)
AST (SGOT): 29 U/L (ref 10–42)
Albumin/Globulin Ratio: 1.36 Ratio (ref 0.80–2.00)
Albumin: 4.5 gm/dL (ref 3.5–5.0)
Alkaline Phosphatase: 64 U/L (ref 40–145)
Anion Gap: 16.4 mMol/L (ref 7.0–18.0)
BUN / Creatinine Ratio: 14.1 Ratio (ref 10.0–30.0)
BUN: 12 mg/dL (ref 7–22)
Bilirubin, Total: 0.4 mg/dL (ref 0.1–1.2)
CO2: 25.6 mMol/L (ref 20.0–30.0)
Calcium: 9.7 mg/dL (ref 8.5–10.5)
Chloride: 103 mMol/L (ref 98–110)
Creatinine: 0.85 mg/dL (ref 0.80–1.30)
EGFR: 137 mL/min/{1.73_m2} (ref 60–150)
Globulin: 3.3 gm/dL (ref 2.0–4.0)
Glucose: 78 mg/dL (ref 71–99)
Osmolality Calculated: 280 mOsm/kg (ref 275–300)
Potassium: 4 mMol/L (ref 3.5–5.3)
Protein, Total: 7.8 gm/dL (ref 6.0–8.3)
Sodium: 141 mMol/L (ref 136–147)

## 2018-08-11 LAB — TROPONIN I: Troponin I: 0.01 ng/mL (ref 0.00–0.02)

## 2018-08-11 LAB — D-DIMER, QUANTITATIVE: D-Dimer: 0.35 mg/L FEU (ref 0.19–0.52)

## 2018-08-11 MED ORDER — ASPIRIN 81 MG PO CHEW
CHEWABLE_TABLET | ORAL | Status: AC
Start: 2018-08-11 — End: ?
  Filled 2018-08-11: qty 4

## 2018-08-11 MED ORDER — ASPIRIN 81 MG PO CHEW
324.00 mg | CHEWABLE_TABLET | Freq: Once | ORAL | Status: AC
Start: 2018-08-11 — End: 2018-08-11
  Administered 2018-08-11: 17:00:00 324 mg via ORAL

## 2018-08-11 MED ORDER — KETOROLAC TROMETHAMINE 30 MG/ML IJ SOLN
30.00 mg | Freq: Once | INTRAMUSCULAR | Status: AC
Start: 2018-08-11 — End: 2018-08-11
  Administered 2018-08-11: 17:00:00 30 mg via INTRAVENOUS

## 2018-08-11 MED ORDER — KETOROLAC TROMETHAMINE 30 MG/ML IJ SOLN
INTRAMUSCULAR | Status: AC
Start: 2018-08-11 — End: ?
  Filled 2018-08-11: qty 1

## 2018-08-11 NOTE — ED Triage Notes (Addendum)
Pt presents via EMS reports he was walking and had onset L sided intermittent CP. Reports it has remained consistent for the last 4 hours. Denies Fevers, SOB, cough, dizziness, abd pain. Pt reports that he was just discharged from a psychiatric facility yesterday and was sent to a shelter in [place]. States "they kicked everyone out this morning so I just started walking because I had once gotten help from a Child psychotherapist in [place] so I started walking that way" . Pt reports he typically gets chest tightness if he breaths in too much car exhaust, repotrs he has been walking on the side of the road since this AM

## 2018-08-11 NOTE — ED Notes (Signed)
Bed: ED8-A  Expected date: 08/11/18  Expected time:   Means of arrival: Ambulance  Comments:

## 2018-08-11 NOTE — ED Provider Notes (Addendum)
Physician/Midlevel provider first contact with patient: 08/11/18 1628         [facility]  EMERGENCY DEPARTMENT  History and Physical Exam       ________________________________________________________________________        Patient Name:  [name], [name]   Age/Sex:  28 y.o.  /  male     Attending Physician:  Kathrynn Running, MD   MRN:  [id number]     PCP:  Marisa Sprinkles, MD   Room:  E8/ED8-A     Patent DOB:  02-11-1991   Encounter Date:  08/11/2018       ________________________________________________________________________      History of Presenting Illness     Chief complaint: Chest Pain    HPI/ROS is limited by: SCHIZOPHRENIA  HPI/ROS given by: patient AND EMS         [name] is a 28 y.o. male who presents to the ED with complaint of Chest Pain      HPI     Context: Patient presents to the emergency department by EMS complaining of chest pain.  History is difficult to obtain because of a psychiatric disease.  He states that he was released from a psychiatric institution in [place].  He was transported up to [place] where it sounds like a housing was arranged.  Patient states that he had to leave in the morning so he did not want to stay at that homeless shelter.  He is admitted that he is homeless.  For some reason he stated he walked from [place] to here.  During the walk he developed chest pain and called 911.    Provocative: Walking    Pallative Factors: None    Quality: Like someone sitting on his chest    Region: Left anterior chest    Radiation: None    Severity: 10 out of 10    Temporal Factors: 4 hours        Associated symptoms: None    Cardiac risk factors.  Positive for smoking.  Negative for hypertension, hypercholesterolemia, family history of early coronary artery disease, and diabetes.         Review of Systems        Review of Systems   Constitutional: Negative for diaphoresis and fever.   Respiratory: Negative for shortness of breath.    Cardiovascular: Positive  for chest pain.   All other systems reviewed and are negative.             Allergies & Medications     Pt has No Known Allergies.    Discharge Medication List as of 08/11/2018  7:17 PM      CONTINUE these medications which have NOT CHANGED    Details   escitalopram (LEXAPRO) 10 MG tablet Take 2 tablets (20 mg total) by mouth daily., Starting Fri 03/12/2017, Print      haloperidol (HALDOL) 5 MG tablet Take 1 tablet (5 mg total) by mouth every 12 (twelve) hours., Starting Fri 03/12/2017, Print      traZODone (DESYREL) 50 MG tablet Take 1 tablet (50 mg total) by mouth nightly as needed for Sleep., Starting Fri 03/12/2017, Print                  Past Medical History     Pt  has a past medical history of Depression and Schizophrenia.           Past Surgical History     Pt  has no past surgical  history on file.         Family History     The family history is not on file.         Social History     Social History     Tobacco Use    Smoking status: Former Smoker     Last attempt to quit: 10/10/2017     Years since quitting: 0.8    Smokeless tobacco: Never Used    Tobacco comment: never smoker   Substance Use Topics    Alcohol use: Yes     Comment: Drinks ciders 3 times a week    Drug use: Not Currently     Frequency: 0.2 times per week     Types: Other-see comments     Comment: He uses "K2" monthly, pt unable to quantify the use.                    Physical Exam     Blood pressure 121/81, pulse 87, temperature 98.2 F (36.8 C), temperature source Oral, resp. rate 12, height 1.676 m, weight 72.6 kg, SpO2 99 %.       Physical Exam   Constitutional: He is oriented to person, place, and time. He appears well-developed and well-nourished. No distress.   HENT:   Head: Normocephalic and atraumatic.   Eyes: Pupils are equal, round, and reactive to light.   Neck: Normal range of motion. Neck supple.   Cardiovascular: Normal rate and regular rhythm.   Pulmonary/Chest: Effort normal and breath sounds normal. No respiratory  distress.   Abdominal: Soft. Bowel sounds are normal. He exhibits no distension. There is no abdominal tenderness.   Musculoskeletal: Normal range of motion.   Neurological: He is alert and oriented to person, place, and time.   Psychiatric: His mood appears anxious. He is withdrawn. He is not actively hallucinating. Thought content is not delusional. He expresses no homicidal and no suicidal ideation. He expresses no suicidal plans and no homicidal plans.   Nursing note and vitals reviewed.           Orders Placed       Orders Placed This Encounter   Procedures    XR Chest AP Portable    CBC and differential    Comprehensive metabolic panel    Troponin I    D-dimer, quantitative    ECG 12 lead    Saline lock IV         ED Medication Orders (From admission, onward)    Start Ordered     Status Ordering Provider    08/11/18 1651 08/11/18 1650  aspirin chewable tablet 324 mg  Once in ED     Route: Oral  Ordered Dose: 324 mg     Last MAR action:  Given Kathrynn Running    08/11/18 1651 08/11/18 1650  ketorolac (TORADOL) injection 30 mg  Once in ED     Route: Intravenous  Ordered Dose: 30 mg     Last MAR action:  Given Kathrynn Running                 Diagnostic Results     Laboratory results reviewed by ED provider:    Results     Procedure Component Value Units Date/Time    D-dimer, quantitative [[id number]] Collected:  08/11/18 1645    Specimen:  Blood Updated:  08/11/18 1850     D-Dimer 0.35 mg/L FEU     Troponin I [[id number]] Collected:  08/11/18 1645    Specimen:  Plasma Updated:  08/11/18 1725     Troponin I 0.01 ng/mL     Comprehensive metabolic panel [[id number]] Collected:  08/11/18 1645    Specimen:  Plasma Updated:  08/11/18 1718     Sodium 141 mMol/L      Potassium 4.0 mMol/L      Chloride 103 mMol/L      CO2 25.6 mMol/L      Calcium 9.7 mg/dL      Glucose 78 mg/dL      Creatinine 4.09 mg/dL      BUN 12 mg/dL      Protein, Total 7.8 gm/dL      Albumin 4.5 gm/dL      Alkaline Phosphatase 64 U/L       ALT 27 U/L      AST (SGOT) 29 U/L      Bilirubin, Total 0.4 mg/dL      Albumin/Globulin Ratio 1.36 Ratio      Anion Gap 16.4 mMol/L      BUN/Creatinine Ratio 14.1 Ratio      EGFR 137 mL/min/1.68m2      Osmolality Calculated 280 mOsm/kg      Globulin 3.3 gm/dL     CBC and differential [[id number]]  (Abnormal) Collected:  08/11/18 1645    Specimen:  Blood Updated:  08/11/18 1704     WBC 12.6 K/cmm      RBC 5.00 M/cmm      Hemoglobin 14.9 gm/dL      Hematocrit 95.6 %      MCV 90 fL      MCH 30 pg      MCHC 33 gm/dL      RDW 21.3 %      PLT CT 190 K/cmm      MPV 7.3 fL      NEUTROPHIL % 77.6 %      Lymphocytes 12.7 %      Monocytes 8.7 %      Eosinophils % 0.1 %      Basophils % 0.9 %      Neutrophils Absolute 9.8 K/cmm      Lymphocytes Absolute 1.6 K/cmm      Monocytes Absolute 1.1 K/cmm      Eosinophils Absolute 0.0 K/cmm      BASO Absolute 0.1 K/cmm           Radiologic study results reviewed by ED provider:    No results found.    Rendering Provider: Kathrynn Running, MD           Procedures / EKG       EKG (interpreted by ED physician): Sinus rhythm.  Ventricular rate of 88.  LVH.  Repolarization abnormality.  No old EKG to compare.     Procedures           MDM:        MDM    Heart Score Calculator    0 points 1 point 2 points    History Slightly suspicious Moderately suspicious Highly suspicious +1   EKG Normal Non-specific repolarization disturbance Significant ST depression +1   Age (years) <45 52 ?65 0   Risk factors* No known risk factors 12 risk factors ?3 risk factors or history of atherosclerotic disease +1   Initial troponin ?normal limit 13 normal limit >3 normal limit 0      Total Score   3     *Risk factors: HTN, hypercholesterolemia, DM, obesity (BMI >30  kg/m), smoking (current, or smoking cessation ?3 month), positive family history (parent or sibling with CVD before age 60); atherosclerotic disease: prior MI, PCI/CABG, CVA/TIA, or peripheral arterial disease.  General Recommendations   (Some  patients may require a more conservative plan of care)   Score 0  3:  Discharge and follow up with PCP. (Major Adverse Cardiac Event risk 2.5% over next six weeks)   Score 4  6: Observe for 6 hours and consider inpatient or early outpatient stress testing in the [facility]. (Major Adverse Cardiac Event risk 20.3% over next six weeks)   Score 7  10: Observe and consider in-hospital stress testing or Cardiology consultation. (Major Adverse Cardiac Event risk 72.7% over next six weeks)  A Major Adverse Cardiac Event was defined as all-cause mortality, myocardial infarction, or coronary revascularization.    Patient presents with chest pain.  His heart score is 3.  He has reproducible chest wall pain.  His pain went away with IV Toradol.  Using care everywhere to review his records he has multiple visits to emergency departments in various locations including [place].  This past March there was a visit there saying that he has had multiple visits and is homeless.  He is still homeless.  He obviously has significant psychiatric issues but is not acutely psychotic homicidal or suicidal.  He is displaced and homeless at the present time.    I am speaking with adult protective services to attempt to find this gentleman from shelter.    [employer]ult [employer] Services was able to get the Mr. Manus a hotel room for the weekend and will work on connecting him either getting him home to [place]     Diagnosis / Disposition:     Final Impression  1. Other chest pain        Disposition  ED Disposition     ED Disposition Condition Date/Time Comment    Discharge  Thu Aug 11, 2018  7:17 PM Stevphen Meuse discharge to home/self care.    Condition at disposition: Stable          Follow up Provider(s):  [facility]  [address]  [place] [address]  [phone]            Prescriptions  Discharge Medication List as of 08/11/2018  7:17 PM                 ______________________________     This document is generated from an EMR system and may have additions and omissions that were not intended by the user.              Kathrynn Running, MD  08/11/18 Norberta Keens       Kathrynn Running, MD  08/13/18 2036

## 2018-08-11 NOTE — ED Notes (Addendum)
Pt given boxed lunch and ice water at this time

## 2018-08-11 NOTE — ED Notes (Signed)
Spoke with dispatch for call to adult protective services to speak with dr zehner

## 2018-08-11 NOTE — EDIE (Signed)
COLLECTIVE?NOTIFICATION?08/11/2018 16:25?[name], [name]?MRN: [id number]    [facility]'s patient encounter information:   OZH:[id number]  Account [id number]  Billing Account [id number]      Criteria Met      3+Facilities in 90 Days    High Utilization (6+ ED Visits/6 Mo.)    Security and Safety  No recent Security Events currently on file    ED Care Guidelines  There are currently no ED Care Guidelines for this patient. Please check your facility's medical records system.        Prescription Monitoring Program  PDMP query found no report.  Narx Score not available at this time.      E.D. Visit Count (12 mo.)  Facility Visits   [name] 2   [facility] 4   [facility] 6   [facility] 1   [facility] 1   Total 14   Note: Visits indicate total known visits.      Recent Emergency Department Visit Summary  Showing 10 most recent visits out of 14 in the past 12 months  Date Facility [facility] Type Diagnoses or Chief Complaint   Aug 11, 2018 [facility] [name]. [place] Emergency      chest pain      Jul 30, 2018 [facility] [place] [facility]      Homelessness      Schizophrenia, unspecified      Suicidal ideations      Jul 02, 2018 [name] Emergency      EMS      CHEST PAIN ADULT EXTREMITY WEAKNESS      1. Pain in right leg      3. Pain in right knee      5. Pain in right hip      7. Chest pain, unspecified      8. Other specified disorders of penis      9. Allergy to peanuts      Jun 30, 2018 [name] [name] Emergency      CHEST PAIN, DIFFICULTY BREATHING      Other chest pain      Other male erectile dysfunction      2. Male erectile dysfunction, unspecified      5. Allergy to peanuts      Jun 29, 2018 [name] [name] [place] Emergency      chest pain      Chest pain, unspecified      2. Other chest pain      Jun 28, 2018 [name] [name] Emergency      KNEE PAIN      RECTAL PAIN KNEE PAIN NON TRAUMATIC      1. Pain in right knee      3. Other specified diseases of anus and rectum      6. Pain in left knee      May 18, 2018 [name] [place] Emergency      Other specified health status      Other specified diseases of anus and rectum      Hemorrhage of anus and rectum      Personal history of nicotine dependence      May 16, 2018 [facility] Emergency Department [name] Emergency  Other chest pain      May 13, 2018 [facility] [place] [place] [name] Emergency      Melena      Hemorrhage of anus and rectum      May 04, 2018 [facility] Emergency Department [name] Emergency      Malingerer [conscious simulation]          Recent Inpatient Visit Summary  Date Facility [facility] Type Diagnoses or Chief Complaint   Jul 31, 2018 [facility] [place] [place] [facility]      Encntr screen exam for mental hlth and behavrl disord, unsp      Other psychoactive substance use, unspecified with psychoacti      Personal history of nicotine dependence      Suicidal ideations          Care Team  There is not a care team on record at this time.   Collective Portal  This patient has registered at the [facility] Emergency Department   For more information visit: [email or web address]     PLEASE NOTE:     1.   Any care recommendations and other clinical information are provided as guidelines or for historical purposes only, and providers should exercise their own clinical judgment when providing care.    2.   You may only use this information for purposes of treatment, payment or health care operations activities, and subject to the limitations of applicable Collective Policies.    3.   You should consult directly with the organization that provided a  care guideline or other clinical history with any questions about additional information or accuracy or completeness of information provided.    ? 2020 [place], [employer] - [email or web address]

## 2018-08-12 LAB — ECG 12-LEAD
P Wave Axis: 61 deg
P-R Interval: 128 ms
Patient Age: 27 years
Q-T Interval(Corrected): 430 ms
Q-T Interval: 355 ms
QRS Axis: 54 deg
QRS Duration: 93 ms
T Axis: 26 years
Ventricular Rate: 88 //min

## 2018-08-13 ENCOUNTER — Emergency Department
Admission: EM | Admit: 2018-08-13 | Discharge: 2018-08-13 | Disposition: A | Discharge status: Home / Self Care | Payer: Self-pay | Attending: Emergency Medicine | Admitting: Emergency Medicine

## 2018-08-13 DIAGNOSIS — R310 Gross hematuria: Secondary | ICD-10-CM | POA: Insufficient documentation

## 2018-08-13 LAB — VH URINALYSIS WITH MICROSCOPIC IF INDICATED
Bilirubin, UA: NEGATIVE mg/dL
Blood, UA: NEGATIVE mg/dL
Glucose, UA: NEGATIVE mg/dL
Ketones UA: NEGATIVE mg/dL
Leukocyte Esterase, UA: NEGATIVE Leu/uL
Nitrite, UA: NEGATIVE
Protein, UR: NEGATIVE mg/dL
Urine Specific Gravity: 1.025 (ref 1.001–1.040)
Urobilinogen, UA: 0.2 mg/dL
pH, Urine: 5.5 pH (ref 5.0–8.0)

## 2018-08-13 NOTE — ED Provider Notes (Signed)
Physician/Midlevel provider first contact with patient: 08/13/18 1032         [facility] EMERGENCY DEPARTMENT History and Physical Exam      Patient Name: [name], [name]  Encounter Date:  08/13/2018  Attending Physician: Justice Britain, MD  PCP: Marisa Sprinkles, MD  Patient DOB:  [birth year]  MRN:  [id number]  Room:  E12/EDA12-A      History of Presenting Illness     Chief complaint: Hematuria    HPI/ROS is limited by: none  HPI/ROS given by: patient    Location: urine  Duration: last night  Severity: moderate    [name] is a 28 y.o. male who presents with reported blood in his urine last night.  He states this occurred once.  He notes that he was intoxicated at the time.  He denies any pain with urination, he denies any flank pain, he denies any prior episodes.  He was able to urinate after emergency department arrival and reports this was uneventful.      Review of Systems     Review of Systems   Constitutional: Negative for fever.   Gastrointestinal: Negative for abdominal pain, nausea and vomiting.   Genitourinary: Positive for hematuria. Negative for dysuria, flank pain and frequency.       Allergies     Pt has No Known Allergies.    Medications     No current facility-administered medications for this encounter.     Current Outpatient Medications:     escitalopram (LEXAPRO) 10 MG tablet, Take 2 tablets (20 mg total) by mouth daily., Disp: 14 tablet, Rfl: 0    haloperidol (HALDOL) 5 MG tablet, Take 1 tablet (5 mg total) by mouth every 12 (twelve) hours., Disp: 28 tablet, Rfl: 0    traZODone (DESYREL) 50 MG tablet, Take 1 tablet (50 mg total) by mouth nightly as needed for Sleep., Disp: 14 tablet, Rfl: 0    ibuprofen (ADVIL,MOTRIN) 200 MG tablet, Take 400 mg by mouth every 6 (six) hours as needed for Pain, Disp: , Rfl:      Past Medical History     Pt has a past medical history of Depression and Schizophrenia.    Past Surgical History     Pt has no past surgical history on file.    Family History     The  family history is not on file.    Social History     Pt reports that he quit smoking about 10 months ago. He has never used smokeless tobacco. He reports current alcohol use. He reports previous drug use. Frequency: 0.20 times per week. Drug: Other-see comments.    Physical Exam     Blood pressure 121/75, pulse 84, temperature 98.7 F (37.1 C), temperature source Oral, resp. rate 18, height 1.676 m, weight 67.6 kg, SpO2 98 %.    Constitutional: Vital signs reviewed. Well appearing.  Head: Normocephalic, atraumatic  Eyes: Conjunctiva and sclera are normal.  No injection or discharge.  Ears, Nose, Throat:  Normal external examination of the nose and ears.    Neck: Normal range of motion. Trachea midline.  Respiratory/Chest: Clear to auscultation. No respiratory distress.   Cardiovascular: Regular rate and rhythm.  Abdomen:  No rebound or guarding. Soft.  Non-tender.  Back: no cva tenderness    Upper Extremity:  No edema. No cyanosis.  Lower Extremity:  No edema. No cyanosis.  Skin: Warm and dry. No rash.  Psychiatric:  Normal affect.    Neuro: alert and  oriented    Orders Placed     Orders Placed This Encounter   Procedures    Urinalysis with Microscopic if Indicated       Diagnostic Results       The results of the diagnostic studies below have been reviewed by myself:    Labs  Results     Procedure Component Value Units Date/Time    Urinalysis with Microscopic if Indicated [[id number]] Collected:  08/13/18 1033    Specimen:  Urine, Random Updated:  08/13/18 1052     Color, UA Yellow     Clarity, UA Clear     Specific Gravity, UR 1.025     pH, Urine 5.5 pH      Protein, UR Negative mg/dL      Glucose, UA Negative mg/dL      Ketones UA Negative mg/dL      Bilirubin, UA Negative mg/dL      Blood, UA Negative mg/dL      Nitrite, UA Negative     Urobilinogen, UA 0.2 mg/dL      Leukocyte Esterase, UA Negative Leu/uL           Radiologic Studies  Radiology Results (24 Hour)     ** No results found for the last 24 hours.  **          EKG: none      MDM / Critical Care     Blood pressure 121/75, pulse 84, temperature 98.7 F (37.1 C), temperature source Oral, resp. rate 18, height 1.676 m, weight 67.6 kg, SpO2 98 %.    DDX includes hematuria, UTI, renal colic among others.    ED Course     Patient appears well here and reports no symptoms.  He reports a single episode of gross hematuria during a time when he was intoxicated.  He is encouraged to return to the emergency department for symptoms such as flank pain, fever, dysuria.         Procedures         Diagnosis / Disposition     Clinical Impression  1. Gross hematuria        Disposition  ED Disposition     ED Disposition Condition Date/Time Comment    Discharge  Sat Aug 13, 2018 10:58 AM Stevphen Meuse discharge to home/self care.    Condition at disposition: Stable          Prescriptions  Discharge Medication List as of 08/13/2018 10:58 AM                     Justice Britain, MD  08/13/18 1146

## 2018-08-13 NOTE — EDIE (Signed)
COLLECTIVE?NOTIFICATION?08/13/2018 10:24?[name], [name]?MRN: [id number]    [facility]'s patient encounter information:   VWU:?[id number]  Account [id number]  Billing Account [id number]      Criteria Met      3+Facilities in 90 Days    High Utilization (6+ ED Visits/6 Mo.)    Security and Safety  No recent Security Events currently on file    ED Care Guidelines  There are currently no ED Care Guidelines for this patient. Please check your facility's medical records system.        Prescription Monitoring Program  PDMP query found no report.  Narx Score not available at this time.      E.D. Visit Count (12 mo.)  Facility Visits   [facility] 2   [facility] 4   [facility] 6   [facility] 1   [facility] 2   Total 15   Note: Visits indicate total known visits.      Recent Emergency Department Visit Summary  Showing 10 most recent visits out of 15 in the past 12 months  Date Facility [facility] Type Diagnoses or Chief Complaint   Aug 13, 2018 [facility] [place] [place] [facility]      blood in urine      Aug 11, 2018 [facility] [place] [place] [facility]      chest pain      Other chest pain      Jul 30, 2018 [facility] [place] [place] [facility]      Homelessness      Schizophrenia, unspecified      Suicidal ideations      Jul 02, 2018 [name] [place] [facility]      EMS      CHEST PAIN ADULT EXTREMITY WEAKNESS      1. Pain in right leg      3. Pain in right knee      5. Pain in right hip      7. Chest pain, unspecified      8. Other specified disorders of penis      9. Allergy to peanuts      Jun 30, 2018 [name] [place] [facility]      CHEST PAIN, DIFFICULTY BREATHING      Other chest pain      Other male erectile dysfunction      2. Male erectile dysfunction, unspecified      5. Allergy to peanuts      Jun 29, 2018 [name] [place] Emergency      chest pain      Chest pain, unspecified      2. Other chest pain      Jun 28, 2018 [name] [place] [facility]      KNEE PAIN      RECTAL PAIN KNEE PAIN NON TRAUMATIC      1. Pain in right knee      3. Other specified diseases of anus and rectum      6. Pain in left knee      May 18, 2018 [facility] [facility]      Other specified health status      Other specified diseases of anus and rectum      Hemorrhage of anus and rectum  Personal history of nicotine dependence      May 16, 2018 [facility] Department [place] Emergency      Other chest pain      May 13, 2018 [facility] [place] [facility]      Melena      Hemorrhage of anus and rectum          Recent Inpatient Visit Summary  Date Facility [facility] Type Diagnoses or Chief Complaint   Jul 31, 2018 [facility] [place] [facility]      Encntr screen exam for mental hlth and behavrl disord, unsp      Personal history of nicotine dependence      Homelessness      Suicidal ideations      Other psychoactive substance use, unspecified with psychoacti      Schizophrenia, unspecified          Care Team  There is not a care team on record at this time.   Collective Portal  This patient has registered at the [facility] Emergency Department   For more information visit: [email or web address]     PLEASE NOTE:     1.   Any care recommendations and other clinical information are provided as guidelines or for historical purposes only, and providers should exercise their own clinical judgment when providing care.    2.   You may only use this information for purposes of treatment, payment or health care operations activities, and subject to the limitations of applicable Collective Policies.    3.   You should consult directly with the  organization that provided a care guideline or other clinical history with any questions about additional information or accuracy or completeness of information provided.    ? 2020 [place], [employer]. - [email or web address][employer]

## 2018-08-13 NOTE — Discharge Instructions (Signed)
Hematuria: Possible Causes     Many things can lead to blood in the urine (hematuria). The blood may be seen with the eye (macroscopic or gross hematuria). Or it may only be seen when the urine is looked at under a microscope (microscopic hematuria). Often no cause for the blood can be found. This is called idiopathic hematuria. Here are some of the most common causes of blood in the urine:   · Kidney or bladder stones. These are collections of crystals. They form in the urine. Stones may be found anywhere in the urinary tract. But they form most often in the kidneys or bladder. In addition to blood in the urine, they can cause severe pain.  · BPH (benign prostatic hyperplasia). This is enlargement of the prostate gland. It happens as men age. BPH often causes problems with urination. It sometimes causes blood in the urine.  · Urinary tract infection (UTI). This is due to bacteria growing in the urinary tract. It can cause blood in the urine. Other symptoms include burning or pain with urination. You may need to urinate often or urgently. You may also have a fever.  · Damage to the urinary tract may cause blood in the urine. This damage may be due to a blow or accident. It may also result from the use of a urinary catheter. Very hard exercise may sometimes irritate the urinary tract and cause bleeding.  · Cancer may occur anywhere in the urinary tract. A tumor may sometimes cause no symptoms other than bleeding.  Other possible causes of bleeding include:   · Prostate gland infection (prostatitis)  · Taking anticoagulants  · Blockage in the urinary tract  · Kidney disease or inflammation  · Cystic diseases of the kidneys  · Sickle cell anemia  · Vigorous exercise  · Endometriosis  [employer] last reviewed this educational content on 10/11/2017  © 2000-2020 [employer] [address], [place] [address]. All rights reserved. This information is not intended as a substitute for professional medical  care. Always follow your healthcare professional's instructions.

## 2019-01-22 ENCOUNTER — Emergency Department
Admission: EM | Admit: 2019-01-22 | Discharge: 2019-01-22 | Disposition: A | Discharge status: Home / Self Care | Payer: Medicaid Other | Attending: Emergency Medicine | Admitting: Emergency Medicine

## 2019-01-22 DIAGNOSIS — Z008 Encounter for other general examination: Secondary | ICD-10-CM

## 2019-01-22 DIAGNOSIS — F259 Schizoaffective disorder, unspecified: Secondary | ICD-10-CM | POA: Insufficient documentation

## 2019-01-22 DIAGNOSIS — R9431 Abnormal electrocardiogram [ECG] [EKG]: Secondary | ICD-10-CM | POA: Insufficient documentation

## 2019-01-22 DIAGNOSIS — Z20828 Contact with and (suspected) exposure to other viral communicable diseases: Secondary | ICD-10-CM | POA: Insufficient documentation

## 2019-01-22 DIAGNOSIS — F329 Major depressive disorder, single episode, unspecified: Secondary | ICD-10-CM | POA: Insufficient documentation

## 2019-01-22 DIAGNOSIS — F29 Unspecified psychosis not due to a substance or known physiological condition: Secondary | ICD-10-CM

## 2019-01-22 DIAGNOSIS — F1911 Other psychoactive substance abuse, in remission: Secondary | ICD-10-CM | POA: Insufficient documentation

## 2019-01-22 DIAGNOSIS — Z87891 Personal history of nicotine dependence: Secondary | ICD-10-CM | POA: Insufficient documentation

## 2019-01-22 LAB — COMPREHENSIVE METABOLIC PANEL
ALT: 25 U/L (ref 0–55)
AST (SGOT): 29 U/L (ref 5–34)
Albumin/Globulin Ratio: 1.4 (ref 0.9–2.2)
Albumin: 4.2 g/dL (ref 3.5–5.0)
Alkaline Phosphatase: 64 U/L (ref 38–106)
Anion Gap: 11 (ref 5.0–15.0)
BUN: 12 mg/dL (ref 9.0–28.0)
Bilirubin, Total: 0.4 mg/dL (ref 0.2–1.2)
CO2: 21 mEq/L — ABNORMAL LOW (ref 22–29)
Calcium: 9.2 mg/dL (ref 8.5–10.5)
Chloride: 105 mEq/L (ref 100–111)
Creatinine: 1 mg/dL (ref 0.7–1.3)
Globulin: 3 g/dL (ref 2.0–3.6)
Glucose: 91 mg/dL (ref 70–100)
Potassium: 3.7 mEq/L (ref 3.5–5.1)
Protein, Total: 7.2 g/dL (ref 6.0–8.3)
Sodium: 137 mEq/L (ref 136–145)

## 2019-01-22 LAB — CBC AND DIFFERENTIAL
Absolute NRBC: 0 10*3/uL (ref 0.00–0.00)
Basophils Absolute Automated: 0.03 10*3/uL (ref 0.00–0.08)
Basophils Automated: 0.3 %
Eosinophils Absolute Automated: 0.02 10*3/uL (ref 0.00–0.44)
Eosinophils Automated: 0.2 %
Hematocrit: 42.4 % (ref 37.6–49.6)
Hgb: 14.2 g/dL (ref 12.5–17.1)
Immature Granulocytes Absolute: 0.03 10*3/uL (ref 0.00–0.07)
Immature Granulocytes: 0.3 %
Lymphocytes Absolute Automated: 1.89 10*3/uL (ref 0.42–3.22)
Lymphocytes Automated: 18.3 %
MCH: 29.2 pg (ref 25.1–33.5)
MCHC: 33.5 g/dL (ref 31.5–35.8)
MCV: 87.2 fL (ref 78.0–96.0)
MPV: 9.7 fL (ref 8.9–12.5)
Monocytes Absolute Automated: 0.68 10*3/uL (ref 0.21–0.85)
Monocytes: 6.6 %
Neutrophils Absolute: 7.69 10*3/uL — ABNORMAL HIGH (ref 1.10–6.33)
Neutrophils: 74.3 %
Nucleated RBC: 0 /100 WBC (ref 0.0–0.0)
Platelets: 204 10*3/uL (ref 142–346)
RBC: 4.86 10*6/uL (ref 4.20–5.90)
RDW: 13 % (ref 11–15)
WBC: 10.34 10*3/uL — ABNORMAL HIGH (ref 3.10–9.50)

## 2019-01-22 LAB — SALICYLATE LEVEL: Salicylate Level: <5 mg/dL — ABNORMAL LOW (ref 15.0–30.0)

## 2019-01-22 LAB — RAPID DRUG SCREEN, URINE
Barbiturate Screen, UR: NEGATIVE
Benzodiazepine Screen, UR: NEGATIVE
Cannabinoid Screen, UR: NEGATIVE
Cocaine, UR: NEGATIVE
Opiate Screen, UR: NEGATIVE
PCP Screen, UR: NEGATIVE
Urine Amphetamine Screen: NEGATIVE

## 2019-01-22 LAB — GFR: EGFR: >60

## 2019-01-22 LAB — MAGNESIUM: Magnesium: 1.8 mg/dL (ref 1.6–2.6)

## 2019-01-22 LAB — ACETAMINOPHEN LEVEL: Acetaminophen Level: <7 ug/mL — ABNORMAL LOW (ref 10–30)

## 2019-01-22 LAB — PHOSPHORUS: Phosphorus: 3.9 mg/dL (ref 2.3–4.7)

## 2019-01-22 LAB — COVID-19 (SARS-COV-2): SARS CoV 2 Overall Result: NEGATIVE

## 2019-01-22 LAB — ETHANOL: Alcohol: NOT DETECTED mg/dL

## 2019-01-22 LAB — TSH: TSH: 1.65 u[IU]/mL (ref 0.35–4.94)

## 2019-01-22 MED ORDER — ENOXAPARIN SODIUM 40 MG/0.4ML SC SOLN
40.00 mg | SUBCUTANEOUS | Status: DC
Start: 2019-01-23 — End: 2019-01-22

## 2019-01-22 NOTE — ED Notes (Signed)
MD Gracelyn Nurse spoke with [name] about plan to send back at [facility]. Psych liason to fax clinicals and discuss mode of transportation back to [facility] for further treatment. Patient updated on this plan at the bedside by writer and ER MD

## 2019-01-22 NOTE — ED Notes (Signed)
Writer contacted [facility] via emergency phone line @ [phone], spoke with Janey Genta, who attempted to connect with Dr. Oren Binet (sending physician to [facility] ED for clearance). MD busy and will Higher education careers adviser later. Call back number provided.

## 2019-01-22 NOTE — ED Notes (Signed)
1825: Pt sent to [facility] ED by Dr. Jed Limerick at [facility] for medical clearance and covid test. Pt is scheduled to go to Crisis Care once he is medically cleared.    Pt not under ECO.    Pt has been assessed by Dr. Jed Limerick so Pt does not need a psych assessment at ED.     If Pt medically cleared, please send Pt by cab back to [facility].    Dr. Jed Limerick asks that staff keep an eye on him b/c Pt, while harmless, is kind of a "wanderer" and is "clueless."

## 2019-01-22 NOTE — ED Notes (Signed)
Writer spoke with MD Oren Binet from [facility], who sent patient to [facility] ED for medical clearance. Per Dr. Oren Binet, patient has been calm and cooperative. MD states " I believe he will do fine in a cab from the hospital (back to [facility]) - we sent him there in a cab and he did just fine." Writer to ensure clinicals are faxed to [facility] and MD Darlin Priestly with sending patient back to [facility] via cab.
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
## 2019-01-22 NOTE — ED Provider Notes (Signed)
[facility] EMERGENCY DEPARTMENT H&P      Visit date: 01/22/2019      CLINICAL SUMMARY           Diagnosis:    .     Final diagnoses:   Psychosis, unspecified psychosis type         MDM Notes:    Patient referred to [place] by [facility] for medical clearance for intake to crisis care.  Patient's precautionary medical screening evaluation demonstrates no evidence of acute medical condition.  Patient is deemed medically clear and stable for psychiatric management.  Patient will be sent back to [facility] for crisis care intake.           Disposition:         Discharge               Discharge Prescriptions     None                         CLINICAL INFORMATION        HPI:      Chief Complaint: medical clearance and Psychiatric Evaluation  .    [name] is a 28 y.o. male who presents with medical clearance evaluation at the direction of [facility] for placement at crisis care.  Patient reportedly has mental health history he is somewhat evasive on history taking states no homicidal ideation no suicidal ideation no delusions no hallucinations.  He states no fever no chills no cough no shortness of breath no chest pain no nausea no vomiting no urinary symptoms no change in bowel habits.    History obtained from: patient, review of prior chart    Epic reflects a history of psychosis and schizoaffective disorder which the patient did not offer at the time of interview      ROS:      Positive and negative ROS elements as per HPI.  All other systems reviewed and negative.      Physical Exam:      Pulse (!) 110   BP 122/73   Resp 16   SpO2 96 %   Temp 98 F (36.7 C)    Physical Exam  Vitals signs and nursing note reviewed.   Constitutional:       General: He is not in acute distress.     Appearance: He is well-developed. He is not diaphoretic.   HENT:      Head: Normocephalic and atraumatic.      Right Ear: External ear normal.      Left Ear: External ear normal.   Eyes:      Conjunctiva/sclera:  Conjunctivae normal.      Pupils: Pupils are equal, round, and reactive to light.   Neck:      Musculoskeletal: Normal range of motion and neck supple.      Thyroid: No thyromegaly.      Trachea: No tracheal deviation.   Cardiovascular:      Rate and Rhythm: Normal rate and regular rhythm.      Heart sounds: Normal heart sounds. No murmur. No friction rub. No gallop.    Pulmonary:      Effort: Pulmonary effort is normal. No respiratory distress.      Breath sounds: Normal breath sounds. No stridor. No wheezing or rales.   Abdominal:      General: There is no distension.      Palpations: Abdomen is soft.      Tenderness:  There is no abdominal tenderness. There is no guarding or rebound.   Musculoskeletal: Normal range of motion.         General: No tenderness.   Skin:     General: Skin is warm and dry.      Findings: No erythema or rash.   Neurological:      Mental Status: He is alert and oriented to person, place, and time.      Cranial Nerves: No cranial nerve deficit.      Coordination: Coordination normal.   Psychiatric:         Mood and Affect: Affect is flat.         Speech: Speech is delayed.         Behavior: Behavior is withdrawn.         Thought Content: Thought content normal.         Judgment: Judgment normal.                    PAST HISTORY        Primary Care Provider: Pcp, None, MD        PMH/PSH:    .     Past Medical History:   Diagnosis Date    Depression     Schizophrenia        He has no past surgical history on file.      Social/Family History:      He reports that he quit smoking about 15 months ago. He has never used smokeless tobacco. He reports current alcohol use. He reports previous drug use. Frequency: 0.20 times per week. Drug: Other-see comments.    History reviewed. No pertinent family history.      Listed Medications on Arrival:    .     Home Medications             escitalopram (LEXAPRO) 10 MG tablet     Take 2 tablets (20 mg total) by mouth daily.     haloperidol (HALDOL) 5 MG  tablet     Take 1 tablet (5 mg total) by mouth every 12 (twelve) hours.     ibuprofen (ADVIL,MOTRIN) 200 MG tablet     Take 400 mg by mouth every 6 (six) hours as needed for Pain     traZODone (DESYREL) 50 MG tablet     Take 1 tablet (50 mg total) by mouth nightly as needed for Sleep.         Allergies: He has No Known Allergies.            VISIT INFORMATION        Clinical Course in the ED:                 Medications Given in the ED:    .     ED Medication Orders (From admission, onward)    None            Procedures:      Procedures      Interpretations:      EKG -             interpreted by me: Sinus 75 QRS axis 60 there is J-point elevation evidenced in V2 V3 V4 V5 this is similar to previous EKGs in 2018.                 RESULTS        Lab Results:      Results  Procedure Component Value Units Date/Time    CBC and differential [[id number]] Collected: 01/22/19 1832    Specimen: Blood Updated: 01/22/19 1843    Urine Rapid Drug Screen [[id number]] Collected: 01/22/19 1832    Specimen: Urine Updated: 01/22/19 1843    COVID-19 (SARS-COV-2) ([place] Rapid)- Behavioral health admission (no isolation) [[id number]] Collected: 01/22/19 1832    Specimen: Nasopharyngeal Swab from Nasopharynx Updated: 01/22/19 1839     Purpose of COVID testing Screening     SARS-CoV-2 Specimen Source Nasopharyngeal    Narrative:      o Collect and clearly label specimen type:  o Upper respiratory specimen: One Nasopharyngeal Dry Swab NO  Transport Media.  o Hand deliver to laboratory ASAP  Indication for testing->Behavioral health admission    Comprehensive metabolic panel [[id number]] Collected: 01/22/19 1832    Specimen: Blood Updated: 01/22/19 1833    Magnesium [[id number]] Collected: 01/22/19 1832    Specimen: Blood Updated: 01/22/19 1833    Phosphorus [[id number]] Collected: 01/22/19 1832    Specimen: Blood Updated: 01/22/19 1833    Ethanol (Alcohol) Level [[id number]] Collected: 01/22/19 1832    Specimen: Blood Updated: 01/22/19 1833     Narrative:      No alcohol for draw    Acetaminophen level [[id number]] Collected: 01/22/19 1832    Specimen: Blood Updated: 01/22/19 1833    Salicylate level [[id number]] Collected: 01/22/19 1832    Specimen: Blood Updated: 01/22/19 1833    TSH [[id number]] Collected: 01/22/19 1832    Specimen: Blood Updated: 01/22/19 1833              Radiology Results:      No orders to display               Scribe Attestation:      No scribe involved in the care of this patient       *This note was generated by the Epic EMR system/ Dragon speech recognition and may contain inherent errors or omissions not intended by the user. Grammatical errors, random word insertions, deletions, pronoun errors and incomplete sentences are occasional consequences of this technology due to software limitations. Not all errors are caught or corrected. If there are questions or concerns about the content of this note or information contained within the body of this dictation they should be addressed directly with the author for clarification.       Lyn Henri, MD  01/22/19 2351

## 2019-01-22 NOTE — ED Notes (Signed)
Belongings being returned by security. Writer spoke with Clerance Lav from psych liason that patient is preparing to leave. Writer also called SW.

## 2019-01-22 NOTE — Progress Notes (Signed)
Systems Case Management Progress Note:     Type of Services Provider Name   Provider Phone Number   Length of Need SCM approved by:  Comments:   Skilled Nursing Facility SNF        Assisted Living        LTAC        Dialysis        Home Health        Infusion        DME        Medications        [place] [phone] 1x Debby Bud, MSW [facility] [address]  [place], [place]   Non-skilled Care ie Private Duty Aide            Debby Bud, MSW  Emergency Department  Social Work Case Manager I   [employer]  [phone]

## 2019-01-22 NOTE — ED Notes (Signed)
Clinicals faxed to [facility]

## 2019-01-22 NOTE — ED Notes (Addendum)
Writer spoke with psych liason Johnny Bridge who confirmed patient is good to go to [facility] and will go via cab. Social worker stated will provide cab voucher (call SW when pt is in lobby ready to leave).    Writer requested belongings at approx 2054 from security. Patient updated.    Liason to help with patient leaving via cab. Call [phone] when pt is ready

## 2019-01-22 NOTE — ED Notes (Signed)
1846: Dr. Jed Limerick, psychiatrist at [facility], calls to request alls labs be faxed to him and asks when Pt will be discharged. Dr. Jed Limerick leaves at 2200 so he would like to see Pt before then.    1848: TW talks with RN Carmie Kanner who states that Pt's belongings from security will need to be gotten and they'll need to get a cab voucher from Social Work to take Pt to [facility] a few blocks away. Callie states there is concern about Pt's being a bit confused and there is concern about Pt getting in cab. TW states that TW or [facility]HT Clerance Lav will come to see that Pt gets in cab. Carmie Kanner will call TW or psych ED office [phone]) once Pt is ready to go.

## 2019-01-22 NOTE — ED Notes (Signed)
Pt changed into green gown/pants and security called for binning/wanding.

## 2019-01-23 LAB — ECG 12-LEAD
Atrial Rate: 77 {beats}/min
P Axis: 51 degrees
P-R Interval: 168 ms
Q-T Interval: 348 ms
QRS Duration: 100 ms
QTC Calculation (Bezet): 393 ms
R Axis: 61 degrees
T Axis: 28 degrees
Ventricular Rate: 77 {beats}/min

## 2020-07-01 IMAGING — CT CT ABDOMEN AND PELVIS WITH CONTRAST
2 of 4 series · 16 of 46 positions shown, 18 images · IV contrast (ISOVUE)
Comparison: None.

CLINICAL DATA: Lower abdominal pain.

EXAM:
CT ABDOMEN AND PELVIS WITH CONTRAST
TECHNIQUE: Multidetector CT imaging of the abdomen and pelvis was performed
using the standard protocol following bolus administration of
intravenous contrast.
CONTRAST:  100mL 8NSMFV-1HH IOPAMIDOL (8NSMFV-1HH) INJECTION 61%

[Series 2: axial st · axial · 0.83mm/px · z∈[+1209,+1599]mm · 13 of 88 slices shown, 15 images]
[im 5/88  soft-tissue]
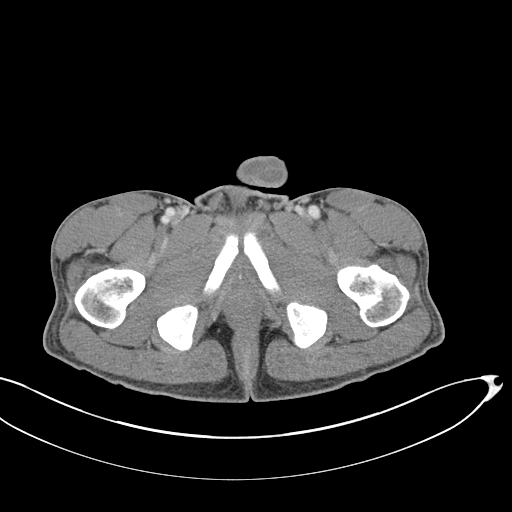
[im 5/88  bone]
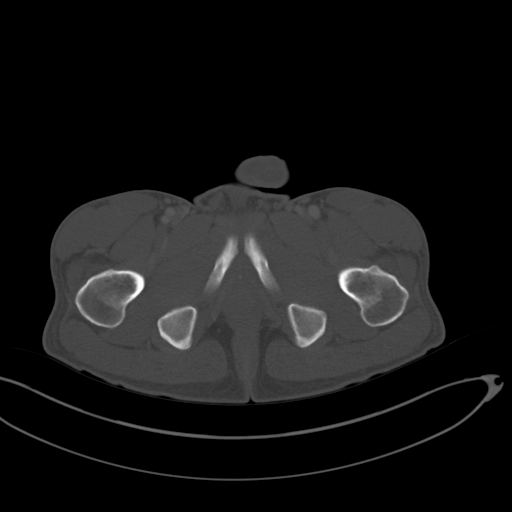
[im 14/88  soft-tissue]
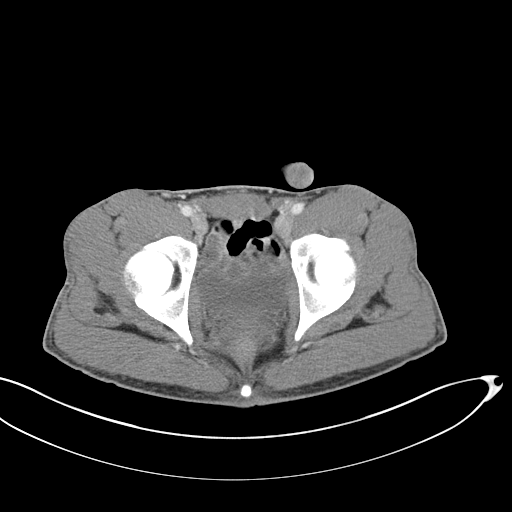
[im 18/88  soft-tissue]
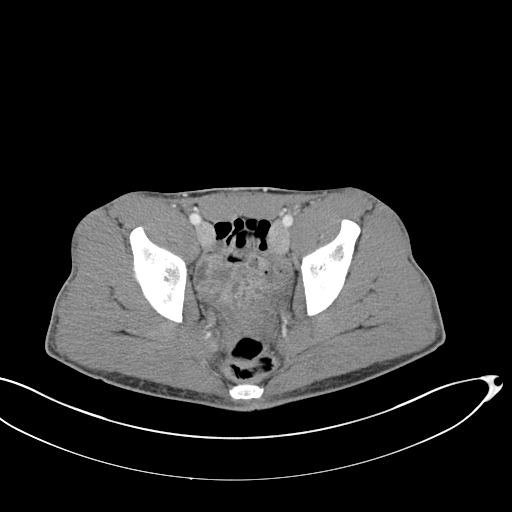
[im 27/88  soft-tissue]
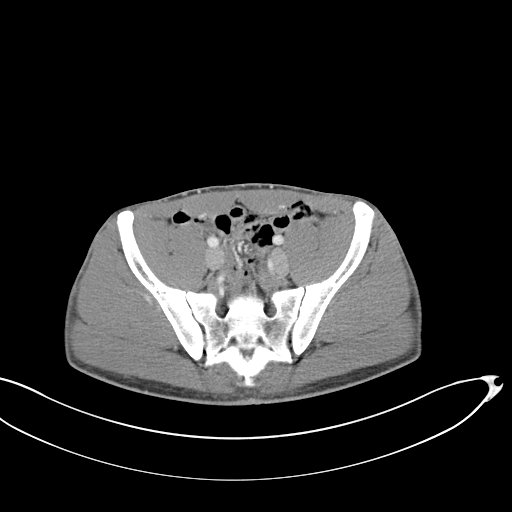
[im 31/88  soft-tissue]
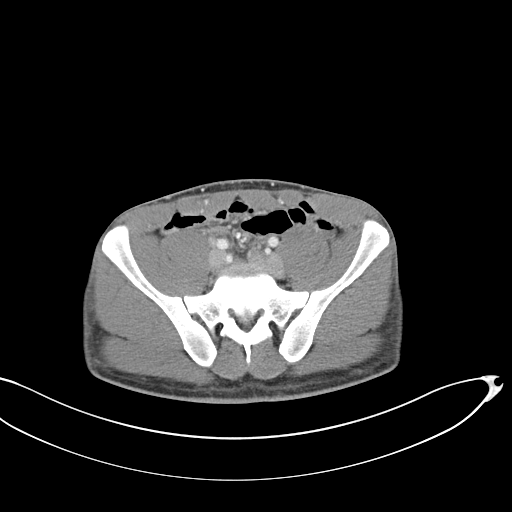
[im 40/88  soft-tissue]
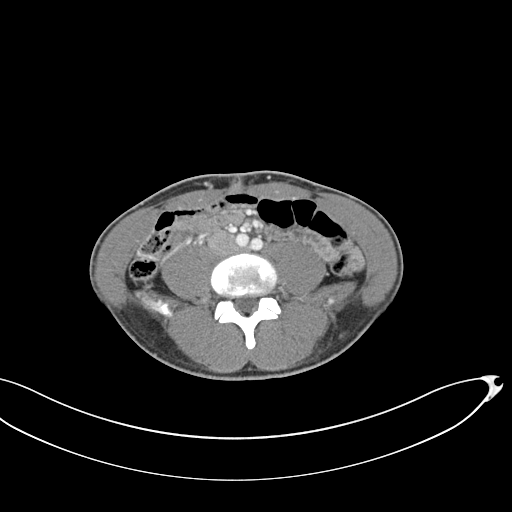
[im 44/88  soft-tissue]
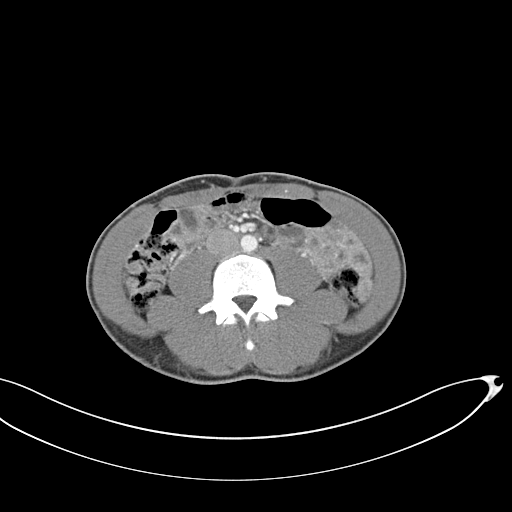
[im 48/88  soft-tissue]
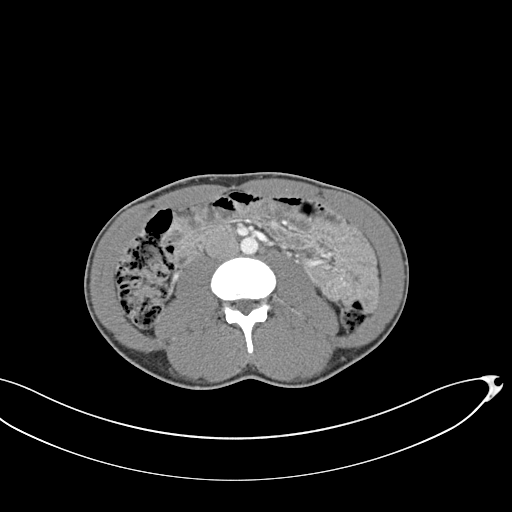
[im 57/88  soft-tissue]
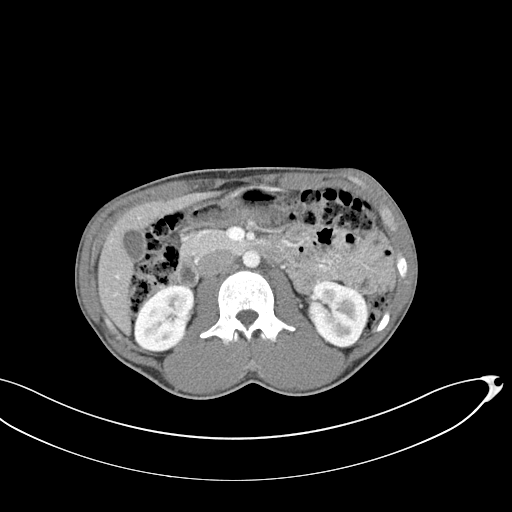
[im 57/88  bone]
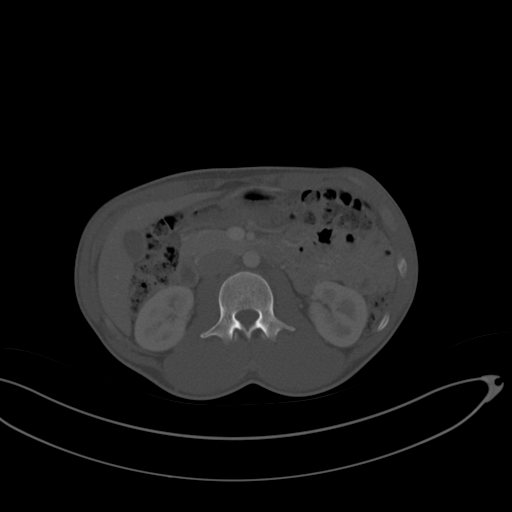
[im 61/88  soft-tissue]
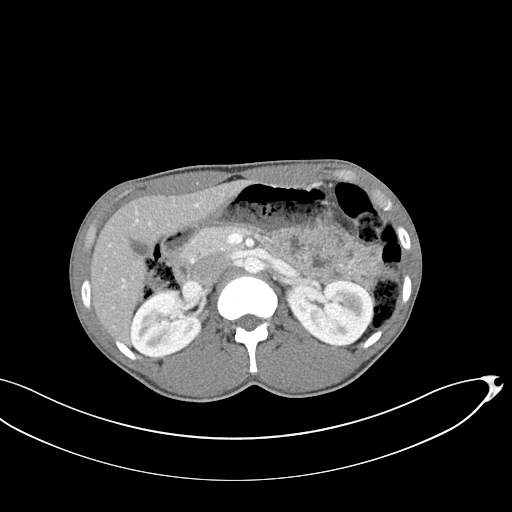
[im 70/88  soft-tissue]
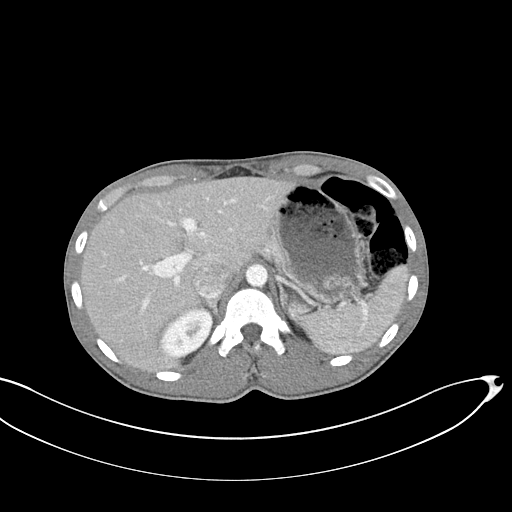
[im 74/88  soft-tissue]
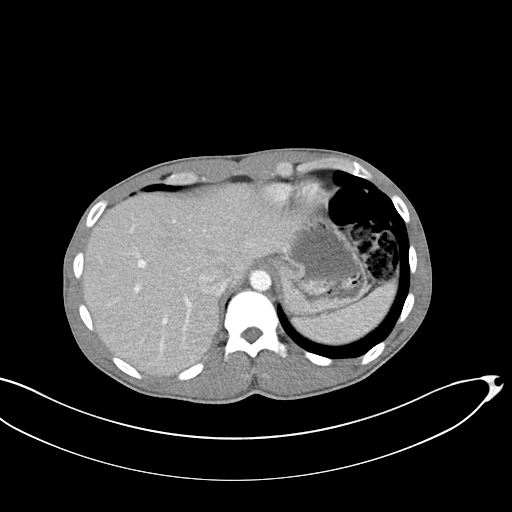
[im 83/88  soft-tissue]
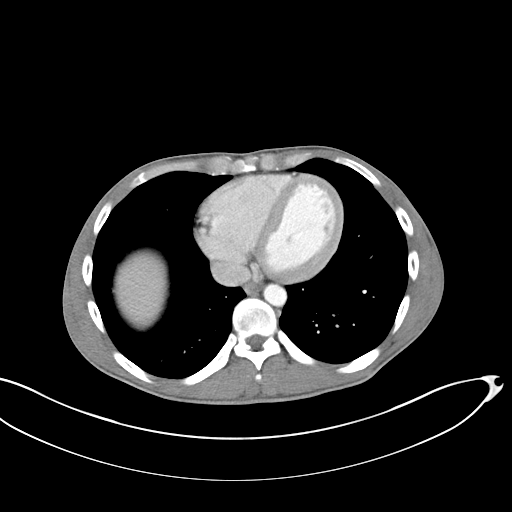

[Series 4: coronal st · coronal · 0.64mm/px · 3 of 151 slices shown]
[im 51/151  soft-tissue]
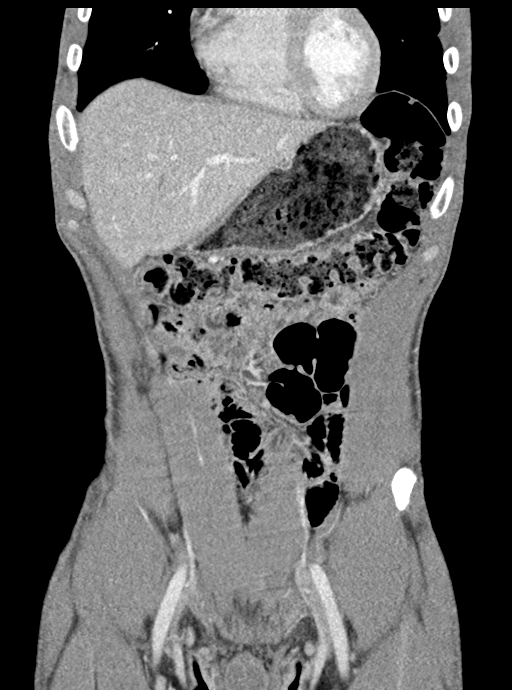
[im 67/151  soft-tissue]
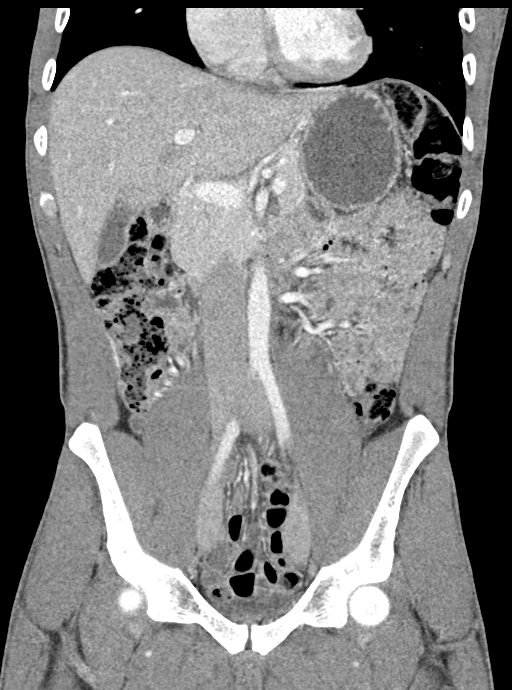
[im 84/151  soft-tissue]
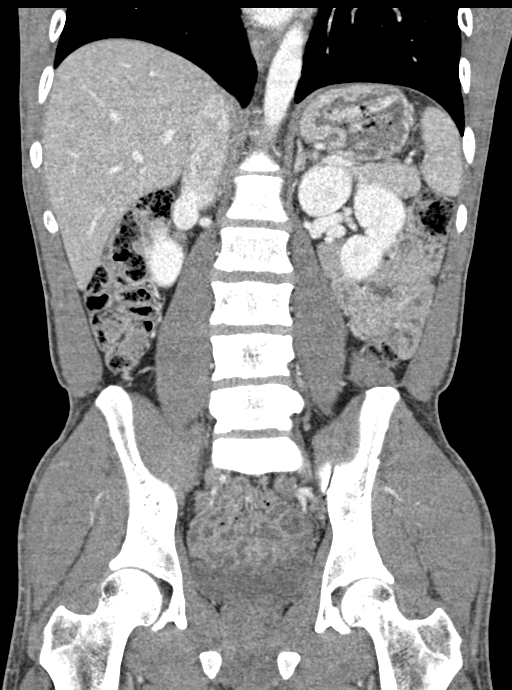

[16 of 46 positions shown; findings below may reference images not displayed]

FINDINGS: Lower chest: Lung bases are clear.

Hepatobiliary: No focal liver abnormality is seen. No gallstones,
gallbladder wall thickening, or biliary dilatation.

Pancreas: Unremarkable. No pancreatic ductal dilatation or
surrounding inflammatory changes.

Spleen: Normal in size without focal abnormality.

Adrenals/Urinary Tract: Adrenal glands are unremarkable. Kidneys are
normal, without renal calculi, focal lesion, or hydronephrosis.
Bladder is unremarkable.

Stomach/Bowel: Stomach, small bowel, and colon are not abnormally
distended. Stool throughout the colon. No inflammatory changes are
appreciated. Appendix is not identified.

Vascular/Lymphatic: No significant vascular findings are present. No
enlarged abdominal or pelvic lymph nodes.

Reproductive: Prostate is unremarkable.

Other: No abdominal wall hernia or abnormality. No abdominopelvic
ascites.

Musculoskeletal: No acute or significant osseous findings.
IMPRESSION: No acute process demonstrated in the abdomen or pelvis. No evidence
of bowel obstruction or inflammation.
